# Patient Record
Sex: Male | Born: 1935 | Race: White | Hispanic: No | Smoking: Former smoker
Health system: Southern US, Community
[De-identification: ages and names within clinical notes are randomized; demographics above are authoritative.]

## PROBLEM LIST (undated history)

## (undated) DIAGNOSIS — N4830 Priapism, unspecified: Secondary | ICD-10-CM

## (undated) DIAGNOSIS — R350 Frequency of micturition: Secondary | ICD-10-CM

## (undated) DIAGNOSIS — N476 Balanoposthitis: Secondary | ICD-10-CM

## (undated) DIAGNOSIS — E039 Hypothyroidism, unspecified: Secondary | ICD-10-CM

## (undated) DIAGNOSIS — E785 Hyperlipidemia, unspecified: Secondary | ICD-10-CM

## (undated) DIAGNOSIS — R351 Nocturia: Secondary | ICD-10-CM

## (undated) DIAGNOSIS — R3911 Hesitancy of micturition: Secondary | ICD-10-CM

## (undated) DIAGNOSIS — C61 Malignant neoplasm of prostate: Secondary | ICD-10-CM

## (undated) HISTORY — DX: Nocturia: R35.1

## (undated) HISTORY — DX: Frequency of micturition: R35.0

## (undated) HISTORY — PX: TRANSURETHRAL RESECTION OF PROSTATE: SHX73

## (undated) HISTORY — PX: CIRCUMCISION: SUR203

## (undated) HISTORY — DX: Hesitancy of micturition: R39.11

## (undated) HISTORY — DX: Priapism, unspecified: N48.30

## (undated) HISTORY — PX: CATARACT EXTRACTION: SUR2

## (undated) HISTORY — DX: Balanoposthitis: N47.6

---

## 1999-05-22 ENCOUNTER — Encounter: Admission: RE | Admit: 1999-05-22 | Discharge: 1999-08-20 | Payer: Self-pay | Admitting: Radiation Oncology

## 2013-11-27 ENCOUNTER — Encounter (HOSPITAL_COMMUNITY): Payer: Self-pay | Admitting: Internal Medicine

## 2013-11-27 ENCOUNTER — Inpatient Hospital Stay (HOSPITAL_COMMUNITY)
Admission: AD | Admit: 2013-11-27 | Discharge: 2013-11-30 | DRG: 682 | Disposition: A | Discharge status: Home Health | Payer: Medicare HMO | Source: Other Acute Inpatient Hospital | Attending: Internal Medicine | Admitting: Internal Medicine

## 2013-11-27 DIAGNOSIS — E875 Hyperkalemia: Secondary | ICD-10-CM | POA: Diagnosis present

## 2013-11-27 DIAGNOSIS — Z66 Do not resuscitate: Secondary | ICD-10-CM | POA: Diagnosis present

## 2013-11-27 DIAGNOSIS — Z8546 Personal history of malignant neoplasm of prostate: Secondary | ICD-10-CM

## 2013-11-27 DIAGNOSIS — N179 Acute kidney failure, unspecified: Secondary | ICD-10-CM | POA: Diagnosis present

## 2013-11-27 DIAGNOSIS — E039 Hypothyroidism, unspecified: Secondary | ICD-10-CM | POA: Diagnosis present

## 2013-11-27 DIAGNOSIS — Z79899 Other long term (current) drug therapy: Secondary | ICD-10-CM | POA: Diagnosis not present

## 2013-11-27 DIAGNOSIS — F17220 Nicotine dependence, chewing tobacco, uncomplicated: Secondary | ICD-10-CM | POA: Diagnosis present

## 2013-11-27 DIAGNOSIS — N1330 Unspecified hydronephrosis: Secondary | ICD-10-CM | POA: Diagnosis present

## 2013-11-27 DIAGNOSIS — R319 Hematuria, unspecified: Secondary | ICD-10-CM | POA: Diagnosis present

## 2013-11-27 DIAGNOSIS — I480 Paroxysmal atrial fibrillation: Secondary | ICD-10-CM | POA: Diagnosis present

## 2013-11-27 DIAGNOSIS — N320 Bladder-neck obstruction: Secondary | ICD-10-CM | POA: Diagnosis present

## 2013-11-27 DIAGNOSIS — R5383 Other fatigue: Secondary | ICD-10-CM | POA: Diagnosis present

## 2013-11-27 DIAGNOSIS — J810 Acute pulmonary edema: Secondary | ICD-10-CM | POA: Diagnosis present

## 2013-11-27 DIAGNOSIS — I10 Essential (primary) hypertension: Secondary | ICD-10-CM | POA: Diagnosis present

## 2013-11-27 DIAGNOSIS — Z23 Encounter for immunization: Secondary | ICD-10-CM | POA: Diagnosis not present

## 2013-11-27 DIAGNOSIS — R918 Other nonspecific abnormal finding of lung field: Secondary | ICD-10-CM

## 2013-11-27 DIAGNOSIS — E785 Hyperlipidemia, unspecified: Secondary | ICD-10-CM | POA: Diagnosis present

## 2013-11-27 DIAGNOSIS — I4891 Unspecified atrial fibrillation: Secondary | ICD-10-CM

## 2013-11-27 DIAGNOSIS — N1339 Other hydronephrosis: Secondary | ICD-10-CM

## 2013-11-27 HISTORY — DX: Hyperlipidemia, unspecified: E78.5

## 2013-11-27 HISTORY — DX: Hypothyroidism, unspecified: E03.9

## 2013-11-27 HISTORY — DX: Malignant neoplasm of prostate: C61

## 2013-11-27 LAB — TROPONIN I: Troponin I: <0.3 ng/mL (ref <0.30)

## 2013-11-27 LAB — BASIC METABOLIC PANEL
Anion gap: 19 — ABNORMAL HIGH (ref 5–15)
BUN: 52 mg/dL — ABNORMAL HIGH (ref 6–23)
CO2: 20 mEq/L (ref 19–32)
Calcium: 9 mg/dL (ref 8.4–10.5)
Chloride: 102 mEq/L (ref 96–112)
Creatinine, Ser: 6.26 mg/dL — ABNORMAL HIGH (ref 0.50–1.35)
GFR calc non Af Amer: 7 mL/min — ABNORMAL LOW (ref >90)
GFR, EST AFRICAN AMERICAN: 8 mL/min — AB (ref >90)
GLUCOSE: 102 mg/dL — AB (ref 70–99)
POTASSIUM: 4.6 meq/L (ref 3.7–5.3)
Sodium: 141 mEq/L (ref 137–147)

## 2013-11-27 LAB — MRSA PCR SCREENING: MRSA BY PCR: NEGATIVE

## 2013-11-27 LAB — LACTIC ACID, PLASMA: LACTIC ACID, VENOUS: 0.9 mmol/L (ref 0.5–2.2)

## 2013-11-27 MED ORDER — ACETAMINOPHEN 325 MG PO TABS
650.0000 mg | ORAL_TABLET | Freq: Four times a day (QID) | ORAL | Status: DC | PRN
  Administered 2013-11-27: 650 mg via ORAL
  Filled 2013-11-27: qty 2

## 2013-11-27 MED ORDER — ONDANSETRON HCL 4 MG/2ML IJ SOLN: 4.0000 mg | Freq: Four times a day (QID) | INTRAMUSCULAR | Status: DC | PRN

## 2013-11-27 MED ORDER — ONDANSETRON HCL 4 MG PO TABS: 4.0000 mg | ORAL_TABLET | Freq: Four times a day (QID) | ORAL | Status: DC | PRN

## 2013-11-27 MED ORDER — SODIUM CHLORIDE 0.45 % IV SOLN
INTRAVENOUS | Status: DC
  Administered 2013-11-27: 1000 mL via INTRAVENOUS

## 2013-11-27 MED ORDER — SODIUM CHLORIDE 0.9 % IJ SOLN
3.0000 mL | Freq: Two times a day (BID) | INTRAMUSCULAR | Status: DC
  Administered 2013-11-27 – 2013-11-28 (×2): 3 mL via INTRAVENOUS

## 2013-11-27 MED ORDER — PANTOPRAZOLE SODIUM 40 MG PO TBEC
40.0000 mg | DELAYED_RELEASE_TABLET | Freq: Every day | ORAL | Status: DC
  Administered 2013-11-28 – 2013-11-30 (×3): 40 mg via ORAL
  Filled 2013-11-27 (×2): qty 1

## 2013-11-27 MED ORDER — DILTIAZEM HCL 25 MG/5ML IV SOLN
10.0000 mg | Freq: Once | INTRAVENOUS | Status: AC
  Administered 2013-11-27: 10 mg via INTRAVENOUS
  Filled 2013-11-27: qty 5

## 2013-11-27 MED ORDER — ACETAMINOPHEN 650 MG RE SUPP: 650.0000 mg | Freq: Four times a day (QID) | RECTAL | Status: DC | PRN

## 2013-11-27 MED ORDER — DEXTROSE 5 % IV SOLN
500.0000 mg | INTRAVENOUS | Status: DC
  Administered 2013-11-27 – 2013-11-28 (×2): 500 mg via INTRAVENOUS
  Filled 2013-11-27 (×3): qty 500

## 2013-11-27 MED ORDER — DOCUSATE SODIUM 100 MG PO CAPS
100.0000 mg | ORAL_CAPSULE | Freq: Two times a day (BID) | ORAL | Status: DC
Start: 2013-11-27 — End: 2013-11-27
  Filled 2013-11-27: qty 1

## 2013-11-27 MED ORDER — DILTIAZEM HCL 100 MG IV SOLR
5.0000 mg/h | INTRAVENOUS | Status: DC
  Administered 2013-11-27 – 2013-11-28 (×2): 5 mg/h via INTRAVENOUS
  Administered 2013-11-29: 15 mg/h via INTRAVENOUS
  Administered 2013-11-29: 10 mg/h via INTRAVENOUS
  Filled 2013-11-27 (×3): qty 100

## 2013-11-27 MED ORDER — LEVOTHYROXINE SODIUM 88 MCG PO TABS
88.0000 ug | ORAL_TABLET | Freq: Every day | ORAL | Status: DC
  Administered 2013-11-28 – 2013-11-30 (×3): 88 ug via ORAL
  Filled 2013-11-27 (×4): qty 1

## 2013-11-27 MED ORDER — HYDROCODONE-ACETAMINOPHEN 5-325 MG PO TABS
1.0000 | ORAL_TABLET | ORAL | Status: DC | PRN
Start: 2013-11-27 — End: 2013-11-30

## 2013-11-27 MED ORDER — DEXTROSE 5 % IV SOLN
1.0000 g | INTRAVENOUS | Status: DC
  Administered 2013-11-27 – 2013-11-29 (×3): 1 g via INTRAVENOUS
  Filled 2013-11-27 (×4): qty 10

## 2013-11-27 NOTE — H&P (Addendum)
PCP:  Laverna Peace, NP  Urology Amalia Ha[facility] at [facility]  Chief Complaint:  Transferred from [facility] ER with ARF and bilateral hydronephrosis  HPI: Derrick Odonnell is a 80 y.o. male   has a past medical history of Cancer; Prostate cancer; Hypothyroidism; and Hyperlipidemia.   Presented with  For the past 1 week  patient have had increased headache and fatigue as well as trouble urinating. He noted that his blood pressure was elevated up to 200/100. Occasional mild cough but no fever.  Patient went to see his PCP and was started on Lisinopril for presumed HTN. Patient did mention some vague left sided chest pain vs presure that has been going on for some time. CBC and CMET was done. Cr was noted to be elevated >10 with prior reading 1.0 in June 2015 and K of 5.5 .  Patient initially presented to [place] ER he was found to have bilateral hydronephrosis and a foley catheter was placed producing >1600 ml of urine. Followed by bloody urine. Patient has continued to put out large amounts of urine. CT of the chest  W/o contrast was done showing bilateral plural effusions and atelectasis vs PNA as well as bilateral hydronephrosis.   From there was transferred to [facility] for evaluation with nephrology. Dr. Thomasene Lot have seen patient on arrival to [facility]. He recommends gentle IV hydration to stay on top of post obstruction diuresis and urology consult.   In regards of his Prostate Cancer he was treated with Lupron but this ahs been discontinued as his PSA have decreased. He has been seen last time by Urology in June 2015.  On arrival to [facility] step down patient was noted to have intermittent short episodes of A.fib. No prior hx of this in the past.    Hospitalist was called for admission for ARF likely postobstructive  Review of Systems:    Pertinent positives include: fatigue, headaches,chest pain, non-productive cough  Constitutional:  No weight loss, night sweats, Fevers, chills, weight loss  HEENT:  No  Difficulty  swallowing,Tooth/dental problems,Sore throat,  No sneezing, itching, ear ache, nasal congestion, post nasal drip,  Cardio-vascular:  No Orthopnea, PND, anasarca, dizziness, palpitations.no Bilateral lower extremity swelling  GI:  No heartburn, indigestion, abdominal pain, nausea, vomiting, diarrhea, change in bowel habits, loss of appetite, melena, blood in stool, hematemesis Resp:  no shortness of breath at rest. No dyspnea on exertion, No excess mucus, no productive cough, No , No coughing up of blood.No change in color of mucus.No wheezing. Skin:  no rash or lesions. No jaundice GU:  no dysuria, change in color of urine, no urgency or frequency. No straining to urinate.  No flank pain.  Musculoskeletal:  No joint pain or no joint swelling. No decreased range of motion. No back pain.  Psych:  No change in mood or affect. No depression or anxiety. No memory loss.  Neuro: no localizing neurological complaints, no tingling, no weakness, no double vision, no gait abnormality, no slurred speech, no confusion  Otherwise ROS are negative except for above, 10 systems were reviewed  Past Medical History: Past Medical History  Diagnosis Date  . Cancer   . Prostate cancer   . Hypothyroidism   . Hyperlipidemia    History reviewed. No pertinent past surgical history.   Medications: Prior to Admission medications   Medication Sig Start Date End Date Taking? Authorizing Provider  ALFALFA PO Take 1 capsule by mouth daily.   Yes Historical Provider, MD  B Complex Vitamins (B COMPLEX PO)  Take 1 tablet by mouth daily.   Yes Historical Provider, MD  Calcium Carb-Cholecalciferol (CALCIUM 600 + D PO) Take 1 tablet by mouth daily.   Yes Historical Provider, MD  Coenzyme Q10 (CO Q 10 PO) Take 1 capsule by mouth daily.   Yes Historical Provider, MD  levothyroxine (SYNTHROID, LEVOTHROID) 88 MCG tablet Take 88 mcg by mouth daily before breakfast.   Yes Historical Provider, MD  lisinopril  (PRINIVIL,ZESTRIL) 10 MG tablet Take 10 mg by mouth daily.   Yes Historical Provider, MD  OVER THE COUNTER MEDICATION Take 1 capsule by mouth daily. OMEGA RED   Yes Historical Provider, MD    Allergies:   Allergies  Allergen Reactions  . Lovastatin     Social History:  Ambulatory   independently   Lives at home   With family  Wife [phone] home, [phone] cell   reports that he has quit smoking. His smokeless tobacco use includes Chew. He reports that he does not drink alcohol or use illicit drugs.    Family History: family history is not on file.    Physical Exam: Patient Vitals for the past 24 hrs:  BP Temp Temp src Pulse Resp SpO2 Height Weight  11/27/13 1805 157/91 mmHg 98.3 F (36.8 C) Oral 76 25 98 % 5\' 11"  (1.803 m) 71.9 kg (158 lb 8.2 oz)    1. General:  in No Acute distress 2. Psychological: Alert and   Oriented 3. Head/ENT:   Moist  Mucous Membranes                          Head Non traumatic, neck supple                          Normal  Dentition 4. SKIN:   decreased Skin turgor,  Skin clean Dry and intact no rash 5. Heart: Regular rate and rhythm no Murmur, Rub or gallop 6. Lungs: Clear to auscultation bilaterally, no wheezes or crackles   7. Abdomen: Soft, non-tender, Non distended 8. Lower extremities: no clubbing, cyanosis, or edema 9. Neurologically Grossly intact, moving all 4 extremities equally 10. MSK: Normal range of motion  body mass index is 22.12 kg/(m^2).   Labs on Admission:  No results found for this basename: NA, K, CL, CO2, GLUCOSE, BUN, CREATININE, CALCIUM, MG, PHOS,  in the last 72 hours No results found for this basename: AST, ALT, ALKPHOS, BILITOT, PROT, ALBUMIN,  in the last 72 hours No results found for this basename: LIPASE, AMYLASE,  in the last 72 hours No results found for this basename: WBC, NEUTROABS, HGB, HCT, MCV, PLT,  in the last 72 hours No results found for this basename: CKTOTAL, CKMB, CKMBINDEX, TROPONINI,   in the last 72 hours No results found for this basename: TSH, T4TOTAL, FREET3, T3FREE, THYROIDAB,  in the last 72 hours No results found for this basename: VITAMINB12, FOLATE, FERRITIN, TIBC, IRON, RETICCTPCT,  in the last 72 hours No results found for this basename: HGBA1C    CrCl is unknown because no creatinine reading has been taken. ABG No results found for this basename: phart, pco2, po2, hco3, tco2, acidbasedef, o2sat     No results found for this basename: DDIMER     Other results:  I have pearsonaly reviewed this: ECG REPORT At [facility] Rate: 80  Rhythm: NSR ST&T Change: non ischemic  At [facility]  HR 109  Rhythm A.fib  w RVR w PVC No ischemia   UA RBC too numerous too count  BNP (last 3 results) No results found for this basename: PROBNP,  in the last 8760 hours  Filed Weights   11/27/13 1805  Weight: 71.9 kg (158 lb 8.2 oz)   Cultures: No results found for this basename: sdes, specrequest, cult, reptstatus    Radiological Exams on Admission: No results found.  Chart has been reviewed  Assessment/Plan  80 yo M with postobstructive RF and elevated K.   Present on Admission:  . ARF (acute renal failure) - now sp foley, will continue to monitor electrolytes, monitor postobstructive diuresis. Gentle IVF as per nephrology. No indication for dialysis at this time . Acute pulmonary edema - bilateral pulmonary effusions will repeat CXR tomorrow to evaluate for true infiltrate after diuresis  . Hyperkalemia - will recheck, monitor on telemetry . Hydronephrosis - secondary to obstruction. Urology consult and follow up. PSA has been ordered, continue foley Abnormal findings on telemetry - intermittent A.fib with RVR new finding, will give dose of diltiazem and if needed will place on diltiazem drip. Echo in AM. Check TSH Given ongoing hematuria and low CHAD2 score will hold off on anticoagulation for now.  Abnormal CT - worrisome for possible infiltrate, given low grade  fever and hx of cough will cover with rocephin and azithromycin for presumed CAP for now, repeat CXR in AM to confirm, obtain lactic acid Prophylaxis: SCD , Protonix  CODE STATUS: DNR /DNI as per patient  Other plan as per orders.  I have spent a total of 65 min on this admission extra time was taken to review to chart from outside hospital  Derrick Odonnell 11/27/2013, 7:58 PM  [facility]  Pager [phone]   after 2 AM please page floor coverage PA If 7AM-7PM, please contact the day team taking care of the patient  [email or web address]  Password [email or web address]

## 2013-11-27 NOTE — Progress Notes (Signed)
Cardizem 10mg  bolus administered IV as ordered. Continuing to monitor pt.

## 2013-11-27 NOTE — Progress Notes (Signed)
Dr. Roel Cluck at pt bedside evaluating pt. Made aware pt had some short runs of Afib.

## 2013-11-27 NOTE — Progress Notes (Signed)
11/27/2013 patient transfer from [facility] Emergency room to [facility] via [place]. He is alert, oriented and was place on telemetry and on oxygen. He arrived to the unit with a foley cath and bloody urine. Patient have bruise on left hip and bruises on arms. Patient is up with one assist, but have little weakness. MRSA screen was completed, CHG bath was completed and observe safety video. [facility] RN.

## 2013-11-27 NOTE — Consult Note (Signed)
Derrick Odonnell is an 80 y.o. male referred by Dr Tana Coast   CC;  Renal failure, hyperkalemia HPI: 80yo WM with hx of prostate Ca sent to [facility] ER today as blood work from his PCP showed a Scr of 10.5.  Saw his PCP yest for fatigue and labs were done.  No Hx HTN but BP was hish 200/100.  CO difficulty urinating x 3 months.  CT in ER showed bil hydro.  Foley cath placed with return of >1600cc.  Scr 1.03 in 6/14.    No past medical history on file.Hypothyroidism, Prostate Ca followed by Dr Iran Planas  No past surgical history on file.  No family history on file.no FH of renal Ds Social History:  has no tobacco, alcohol, and drug history on file.Non smoker, nondrinker.  Married. 2 children.  Worked as Administrator for [employer]: Allergies not on file  No prescriptions prior to admission     Lab Results: UA: TNTC RBC's  Done post cath placement  No results found for this basename: WBC, HGB, HCT, PLT,  in the last 72 hours BMET No results found for this basename: NA, K, CL, CO2, GLUCOSE, BUN, CREATININE, CALCIUM, MAG, PHOS,  in the last 72 hours LFT No results found for this basename: PROT, ALBUMIN, AST, ALT, ALKPHOS, BILITOT, BILIDIR, IBILI,  in the last 72 hours No results found.  ROS: No change in vision Decreased hearing, wears hearing aids Had SOB earlier, better now Sxs of reflux No abd pain + edema No neurologic Sxs   PHYSICAL EXAM: Blood pressure 157/91, pulse 76, temperature 98.3 F (36.8 C), temperature source Oral, resp. rate 25, height 5\' 11"  (1.803 m), weight 71.9 kg (158 lb 8.2 oz), SpO2 98.00%. HEENT: PERRLA EOMI NECK:no JVD, No bruits LUNGS:Decreased BS in bases with few faint crackles CARDIAC:RRR wo MRG ABD:+ BS NTND No HSM DPO:EUMPN-3+ edema NEURO:CNI M&SI OX3 no asterixis  Assessment: 1. AKI sec bladder outlet obstruction 2. Mild hyperkalemia 3.   Hx Prostate CA  PLAN: 1. Cont with foley drainage 2. K will be corrected now that he is  unobstructed 3. Check PSA 4. Will need urology to see 5. Daily Scr 6. Will start low rate IV fluids 50cc/hr to prevent volume depletion from post obs diuresis and this can be increased if gets into too neg of a fluid balance 7.   Reg diet with 2gm K restriction for now   Aeralyn Barna T 11/27/2013, 7:07 PM

## 2013-11-28 ENCOUNTER — Inpatient Hospital Stay (HOSPITAL_COMMUNITY): Payer: Medicare HMO

## 2013-11-28 ENCOUNTER — Encounter (HOSPITAL_COMMUNITY): Payer: Self-pay | Admitting: *Deleted

## 2013-11-28 DIAGNOSIS — N1330 Unspecified hydronephrosis: Secondary | ICD-10-CM

## 2013-11-28 DIAGNOSIS — I517 Cardiomegaly: Secondary | ICD-10-CM

## 2013-11-28 DIAGNOSIS — R319 Hematuria, unspecified: Secondary | ICD-10-CM

## 2013-11-28 LAB — RENAL FUNCTION PANEL
ANION GAP: 15 (ref 5–15)
Albumin: 3 g/dL — ABNORMAL LOW (ref 3.5–5.2)
BUN: 42 mg/dL — ABNORMAL HIGH (ref 6–23)
CHLORIDE: 103 meq/L (ref 96–112)
CO2: 21 meq/L (ref 19–32)
Calcium: 8.8 mg/dL (ref 8.4–10.5)
Creatinine, Ser: 4.72 mg/dL — ABNORMAL HIGH (ref 0.50–1.35)
GFR calc Af Amer: 12 mL/min — ABNORMAL LOW (ref >90)
GFR, EST NON AFRICAN AMERICAN: 10 mL/min — AB (ref >90)
Glucose, Bld: 107 mg/dL — ABNORMAL HIGH (ref 70–99)
POTASSIUM: 4.4 meq/L (ref 3.7–5.3)
Phosphorus: 3.7 mg/dL (ref 2.3–4.6)
SODIUM: 139 meq/L (ref 137–147)

## 2013-11-28 LAB — CBC
HCT: 33 % — ABNORMAL LOW (ref 39.0–52.0)
Hemoglobin: 11.1 g/dL — ABNORMAL LOW (ref 13.0–17.0)
MCH: 29.4 pg (ref 26.0–34.0)
MCHC: 33.6 g/dL (ref 30.0–36.0)
MCV: 87.3 fL (ref 78.0–100.0)
Platelets: 222 10*3/uL (ref 150–400)
RBC: 3.78 MIL/uL — ABNORMAL LOW (ref 4.22–5.81)
RDW: 13 % (ref 11.5–15.5)
WBC: 8.7 10*3/uL (ref 4.0–10.5)

## 2013-11-28 LAB — MAGNESIUM: MAGNESIUM: 2 mg/dL (ref 1.5–2.5)

## 2013-11-28 LAB — HEMOGLOBIN A1C
Hgb A1c MFr Bld: 5.5 % (ref <5.7)
Mean Plasma Glucose: 111 mg/dL (ref <117)

## 2013-11-28 LAB — PROCALCITONIN: Procalcitonin: <0.1 ng/mL

## 2013-11-28 LAB — TSH: TSH: 2.78 u[IU]/mL (ref 0.350–4.500)

## 2013-11-28 LAB — PHOSPHORUS: PHOSPHORUS: 3.6 mg/dL (ref 2.3–4.6)

## 2013-11-28 MED ORDER — METHYLPREDNISOLONE SODIUM SUCC 125 MG IJ SOLR
60.0000 mg | INTRAMUSCULAR | Status: DC
Start: 2013-11-28 — End: 2013-11-30
  Administered 2013-11-28 – 2013-11-29 (×2): 60 mg via INTRAVENOUS
  Filled 2013-11-28 (×3): qty 0.96

## 2013-11-28 MED ORDER — INFLUENZA VAC SPLIT QUAD 0.5 ML IM SUSY
0.5000 mL | PREFILLED_SYRINGE | INTRAMUSCULAR | Status: AC
  Administered 2013-11-29: 0.5 mL via INTRAMUSCULAR
  Filled 2013-11-28: qty 0.5

## 2013-11-28 MED ORDER — SODIUM CHLORIDE 0.9 % IV SOLN
INTRAVENOUS | Status: DC
  Administered 2013-11-28: 1000 mL via INTRAVENOUS
  Administered 2013-11-29: 08:00:00 via INTRAVENOUS

## 2013-11-28 MED ORDER — PNEUMOCOCCAL VAC POLYVALENT 25 MCG/0.5ML IJ INJ
0.5000 mL | INJECTION | INTRAMUSCULAR | Status: DC
  Filled 2013-11-28: qty 0.5

## 2013-11-28 NOTE — Progress Notes (Signed)
Dr Roel Cluck notified of pt's conversion from Afib to NSR with PVCs.

## 2013-11-28 NOTE — Progress Notes (Signed)
Echo Lab  2D Echocardiogram completed.  [place], RDCS 11/28/2013 10:27 AM

## 2013-11-28 NOTE — Progress Notes (Signed)
S: sitting up in chair.  No new CO O:BP 130/63  Pulse 70  Temp(Src) 98.8 F (37.1 C) (Oral)  Resp 23  Ht 5\' 11"  (1.803 m)  Wt 67.5 kg (148 lb 13 oz)  BMI 20.76 kg/m2  SpO2 94%  Intake/Output Summary (Last 24 hours) at 11/28/13 0831 Last data filed at 11/28/13 0400  Gross per 24 hour  Intake 331.75 ml  Output   5100 ml  Net -4768.25 ml   Weight change:  TWK:MQKMM and alert CVS:RRR Resp: decreased BS bases Abd:+ BS NTND Ext: no edema NEURO:CNI Ox3 no asterixis   . azithromycin  500 mg Intravenous Q24H  . cefTRIAXone (ROCEPHIN) IVPB 1 gram/50 mL D5W  1 g Intravenous Q24H  . levothyroxine  88 mcg Oral QAC breakfast  . pantoprazole  40 mg Oral Q1200  . sodium chloride  3 mL Intravenous Q12H   No results found. BMET    Component Value Date/Time   NA 139 11/28/2013 0225   K 4.4 11/28/2013 0225   CL 103 11/28/2013 0225   CO2 21 11/28/2013 0225   GLUCOSE 107* 11/28/2013 0225   BUN 42* 11/28/2013 0225   CREATININE 4.72* 11/28/2013 0225   CALCIUM 8.8 11/28/2013 0225   GFRNONAA 10* 11/28/2013 0225   GFRAA 12* 11/28/2013 0225   CBC    Component Value Date/Time   WBC 8.7 11/28/2013 0225   RBC 3.78* 11/28/2013 0225   HGB 11.1* 11/28/2013 0225   HCT 33.0* 11/28/2013 0225   PLT 222 11/28/2013 0225   MCV 87.3 11/28/2013 0225   MCH 29.4 11/28/2013 0225   MCHC 33.6 11/28/2013 0225   RDW 13.0 11/28/2013 0225     Assessment:  1. AKI sec BOO, improving 2. Hyperkalemia, resolved Plan: 1.  Renal fx should cont to improve.  Urology needs to address his obstruction.  Will sign off.  Call if further nephrologic issues.  Increase IV fluids as needed to prevent volume depletion   Anneliese Leblond T

## 2013-11-28 NOTE — Progress Notes (Signed)
[place] TEAM 1 - Stepdown/ICU TEAM Progress Note  Raymir Frommelt SJG:[id number] DOB: [birth year] DOA: 11/27/2013 PCP: Laverna Peace, NP  Admit HPI / Brief Narrative: Derrick Odonnell is a 80 y.o. WM PMHx  Prostate cancer; Hypothyroidism; and Hyperlipidemia.  Presented with  For the past 1 week patient have had increased headache and fatigue as well as trouble urinating. He noted that his blood pressure was elevated up to 200/100. Occasional mild cough but no fever. Patient went to see his PCP and was started on Lisinopril for presumed HTN. Patient did mention some vague left sided chest pain vs presure that has been going on for some time. CBC and CMET was done. Cr was noted to be elevated >10 with prior reading 1.0 in June 2015 and K of 5.5 .  Patient initially presented to [place] ER he was found to have bilateral hydronephrosis and a foley catheter was placed producing >1600 ml of urine. Followed by bloody urine. Patient has continued to put out large amounts of urine.  CT of the chest W/o contrast was done showing bilateral plural effusions and atelectasis vs PNA as well as bilateral hydronephrosis. From there was transferred to [facility] for evaluation with nephrology. Dr. Thomasene Lot have seen patient on arrival to [facility]. He recommends gentle IV hydration to stay on top of post obstruction diuresis and urology consult.  In regards of his Prostate Cancer he was treated with Lupron but this has been discontinued as his PSA have decreased. He has been seen last time by Urology in June 2015.  On arrival to [facility] step down patient was noted to have intermittent short episodes of A.fib. No prior hx of this in the past.  Hospitalist was called for admission for ARF likely postobstructive   HPI/Subjective: 10/10 A./O. x4, NAD, states never had hematuria in the past. Negative SOB, negative CP, negative abdominal pain    Assessment/Plan:  ARF (acute renal failure) - Creatinine improving with hydration -Continue hydration  normal saline now sp foley, will continue to monitor electrolytes -, monitor postobstructive diuresis.  -Gentle IVF as per nephrology. No indication for dialysis at this time  Hydronephrosis with hematuria -Renal fx should cont to improve per nephrology -Consult Urology in a.m. to address his obstruction (most likely will be seen as outpatient) -PSA pending -Hold anticoagulation  CAP  -Abnormal CT - worrisome for possible infiltrate, given low grade fever and hx of cough will cover with rocephin and azithromycin -CXR consistent with CAP, with small bilateral pleural effusion  -Solu-Medrol 60 mg daily -Flutter valve q4 hr while awake -No wheezing on exam so will hold off on bronchodilators  Hyperkalemia -Resolved, continue monitor   A. fib with RVR -Resolved  -Continue Cardizem drip, will transition to PO in a.m. -TSH within normal limit -Troponin x1 negative -Cardiology consult pending -Echocardiogram pending -Strict in and out -Daily weight     Code Status: FULL Family Communication: no family present at time of exam Disposition Plan: Resolution of A. fib, hydronephrosis   Consultants: Cardiology pending Dr. Fleet Contras (Nephrology)    Procedure/Significant Events: 10/10 CXR;Small bilateral pleural effusion with bilateral basilar atelectasis or infiltrate    Culture 10/9 MRSA by PCR negative 10/9 blood x2 pending 10/10 urine pending   Antibiotics: Azithromycin 10/9>> Ceftriaxone 10/9>>   DVT prophylaxis: SCD   Devices    LINES / TUBES:      Continuous Infusions: . sodium chloride 50 mL/hr at 11/28/13 1930  . diltiazem (CARDIZEM) infusion 5 mg/hr (11/28/13 1930)  Objective: VITAL SIGNS: Temp: 98.8 F (37.1 C) (10/10 1927) Temp Source: Oral (10/10 1927) BP: 157/73 mmHg (10/10 1927) Pulse Rate: 78 (10/10 1927) SPO2; FIO2:   Intake/Output Summary (Last 24 hours) at 11/28/13 1945 Last data filed at 11/28/13 1930  Gross per  24 hour  Intake 2061.75 ml  Output   5825 ml  Net -3763.25 ml     Exam: General: A./O. x4, NAD, No acute respiratory distress, some hematuria (clearing per patient and RN) Lungs: Clear to auscultation bilaterally without wheezes or crackles Cardiovascular: Regular rate and rhythm without murmur gallop or rub normal S1 and S2 Abdomen: Nontender, nondistended, soft, bowel sounds positive, no rebound, no ascites, no appreciable mass Extremities: No significant cyanosis, clubbing, or edema bilateral lower extremities  Data Reviewed: Basic Metabolic Panel:  Recent Labs Lab 11/27/13 2100 11/28/13 0225  NA 141 139  K 4.6 4.4  CL 102 103  CO2 20 21  GLUCOSE 102* 107*  BUN 52* 42*  CREATININE 6.26* 4.72*  CALCIUM 9.0 8.8  MG  --  2.0  PHOS  --  3.7  3.6   Liver Function Tests:  Recent Labs Lab 11/28/13 0225  ALBUMIN 3.0*   No results found for this basename: LIPASE, AMYLASE,  in the last 168 hours No results found for this basename: AMMONIA,  in the last 168 hours CBC:  Recent Labs Lab 11/28/13 0225  WBC 8.7  HGB 11.1*  HCT 33.0*  MCV 87.3  PLT 222   Cardiac Enzymes:  Recent Labs Lab 11/27/13 2100  TROPONINI <0.30   BNP (last 3 results) No results found for this basename: PROBNP,  in the last 8760 hours CBG: No results found for this basename: GLUCAP,  in the last 168 hours  Recent Results (from the past 240 hour(s))  MRSA PCR SCREENING     Status: None   Collection Time    11/27/13  6:30 PM      Result Value Ref Range Status   MRSA by PCR NEGATIVE  NEGATIVE Final   Comment:            The GeneXpert MRSA Assay (FDA     approved for NASAL specimens     only), is one component of a     comprehensive MRSA colonization     surveillance program. It is not     intended to diagnose MRSA     infection nor to guide or     monitor treatment for     MRSA infections.     Studies:  Recent x-ray studies have been reviewed in detail by the Attending  Physician  Scheduled Meds:  Scheduled Meds: . azithromycin  500 mg Intravenous Q24H  . cefTRIAXone (ROCEPHIN) IVPB 1 gram/50 mL D5W  1 g Intravenous Q24H  . [START ON 11/29/2013] Influenza vac split quadrivalent PF  0.5 mL Intramuscular Tomorrow-1000  . levothyroxine  88 mcg Oral QAC breakfast  . pantoprazole  40 mg Oral Q1200  . [START ON 11/29/2013] pneumococcal 23 valent vaccine  0.5 mL Intramuscular Tomorrow-1000  . sodium chloride  3 mL Intravenous Q12H    Time spent on care of this patient: 40 mins   Allie Bossier , MD   [facility] Office  [phone] Pager [phone]  On-Call/Text Page:      [email or web address]      password [email or web address]  If 7PM-7AM, please contact night-coverage [email or web address] Password [email or web address] 11/28/2013, 7:45 PM   LOS: 1 day

## 2013-11-29 ENCOUNTER — Inpatient Hospital Stay (HOSPITAL_COMMUNITY): Payer: Medicare HMO

## 2013-11-29 DIAGNOSIS — I4891 Unspecified atrial fibrillation: Secondary | ICD-10-CM

## 2013-11-29 DIAGNOSIS — N179 Acute kidney failure, unspecified: Principal | ICD-10-CM

## 2013-11-29 LAB — COMPREHENSIVE METABOLIC PANEL
ALT: 18 U/L (ref 0–53)
AST: 24 U/L (ref 0–37)
Albumin: 2.9 g/dL — ABNORMAL LOW (ref 3.5–5.2)
Alkaline Phosphatase: 119 U/L — ABNORMAL HIGH (ref 39–117)
Anion gap: 17 — ABNORMAL HIGH (ref 5–15)
BUN: 23 mg/dL (ref 6–23)
CALCIUM: 8.8 mg/dL (ref 8.4–10.5)
CO2: 18 meq/L — AB (ref 19–32)
Chloride: 103 mEq/L (ref 96–112)
Creatinine, Ser: 1.65 mg/dL — ABNORMAL HIGH (ref 0.50–1.35)
GFR calc Af Amer: 42 mL/min — ABNORMAL LOW (ref >90)
GFR calc non Af Amer: 36 mL/min — ABNORMAL LOW (ref >90)
Glucose, Bld: 148 mg/dL — ABNORMAL HIGH (ref 70–99)
Potassium: 4.3 mEq/L (ref 3.7–5.3)
SODIUM: 138 meq/L (ref 137–147)
TOTAL PROTEIN: 7 g/dL (ref 6.0–8.3)
Total Bilirubin: 0.3 mg/dL (ref 0.3–1.2)

## 2013-11-29 LAB — LACTIC ACID, PLASMA: Lactic Acid, Venous: 0.8 mmol/L (ref 0.5–2.2)

## 2013-11-29 LAB — CBC WITH DIFFERENTIAL/PLATELET
Basophils Absolute: 0 10*3/uL (ref 0.0–0.1)
Basophils Relative: 0 % (ref 0–1)
EOS ABS: 0 10*3/uL (ref 0.0–0.7)
EOS PCT: 0 % (ref 0–5)
HCT: 34.3 % — ABNORMAL LOW (ref 39.0–52.0)
HEMOGLOBIN: 11.6 g/dL — AB (ref 13.0–17.0)
LYMPHS ABS: 0.5 10*3/uL — AB (ref 0.7–4.0)
Lymphocytes Relative: 5 % — ABNORMAL LOW (ref 12–46)
MCH: 29.8 pg (ref 26.0–34.0)
MCHC: 33.8 g/dL (ref 30.0–36.0)
MCV: 88.2 fL (ref 78.0–100.0)
Monocytes Absolute: 0.2 10*3/uL (ref 0.1–1.0)
Monocytes Relative: 2 % — ABNORMAL LOW (ref 3–12)
Neutro Abs: 8.7 10*3/uL — ABNORMAL HIGH (ref 1.7–7.7)
Neutrophils Relative %: 93 % — ABNORMAL HIGH (ref 43–77)
Platelets: 214 10*3/uL (ref 150–400)
RBC: 3.89 MIL/uL — AB (ref 4.22–5.81)
RDW: 12.9 % (ref 11.5–15.5)
WBC: 9.4 10*3/uL (ref 4.0–10.5)

## 2013-11-29 LAB — MAGNESIUM: Magnesium: 1.9 mg/dL (ref 1.5–2.5)

## 2013-11-29 MED ORDER — DILTIAZEM HCL ER 60 MG PO CP12
60.0000 mg | ORAL_CAPSULE | Freq: Two times a day (BID) | ORAL | Status: DC
  Administered 2013-11-29 (×2): 60 mg via ORAL
  Filled 2013-11-29 (×4): qty 1

## 2013-11-29 MED ORDER — AZITHROMYCIN 500 MG PO TABS
500.0000 mg | ORAL_TABLET | Freq: Every day | ORAL | Status: DC
  Administered 2013-11-29 – 2013-11-30 (×2): 500 mg via ORAL
  Filled 2013-11-29 (×2): qty 1

## 2013-11-29 MED ORDER — METOPROLOL TARTRATE 1 MG/ML IV SOLN
2.5000 mg | Freq: Once | INTRAVENOUS | Status: AC
  Administered 2013-11-29: 2.5 mg via INTRAVENOUS
  Filled 2013-11-29: qty 5

## 2013-11-29 NOTE — Progress Notes (Addendum)
Fredirick Maudlin NP made awareof increase of A Fib rates 110-130's. Informed that Cardizem drip now infusing at 15mg /hr. New orders received. 0705 Lopressor 2.5mg  administered IV as ordered.HR down to 80-90's Cardizem drip titrated down to 10mg /hr. VSS 99 23 95% 130/86. Pt sleeping comfortably.

## 2013-11-29 NOTE — Progress Notes (Signed)
Derrick Odonnell - Stepdown/ICU TEAM Progress Note  Derrick Odonnell HQI:[id number] DOB: May 31, 1926 DOA: 11/27/2013 PCP: Derrick Peace, NP  Admit HPI / Brief Narrative: Derrick Odonnell is a 80 y.o. WM PMHx  Prostate cancer; Hypothyroidism; and Hyperlipidemia.  Presented with  For the past Odonnell week patient have had increased headache and fatigue as well as trouble urinating. He noted that his blood pressure was elevated up to 200/100. Occasional mild cough but no fever. Patient went to see his PCP and was started on Lisinopril for presumed HTN. Patient did mention some vague left sided chest pain vs presure that has been going on for some time. CBC and CMET was done. Cr was noted to be elevated >10 with prior reading Odonnell.0 in June 2015 and K of 5.5 .  Patient initially presented to [place] ER he was found to have bilateral hydronephrosis and a foley catheter was placed producing >1600 ml of urine. Followed by bloody urine. Patient has continued to put out large amounts of urine.  CT of the chest W/o contrast was done showing bilateral plural effusions and atelectasis vs PNA as well as bilateral hydronephrosis. From there was transferred to [facility] for evaluation with nephrology. Dr. Thomasene Odonnell have seen patient on arrival to [facility]. He recommends gentle IV hydration to stay on top of post obstruction diuresis and urology consult.  In regards of his Prostate Cancer he was treated with Lupron but this has been discontinued as his PSA have decreased. He has been seen last time by Urology in June 2015.  On arrival to [facility] step down patient was noted to have intermittent short episodes of A.fib. No prior hx of this in the past.  Hospitalist was called for admission for ARF likely postobstructive   HPI/Subjective: 10/11 A./O. x4, NAD, states never had hematuria in the past. Negative SOB, negative CP, negative abdominal pain. States Derrick Odonnell (urology) is his urologist, last visit was June 2015 at which time his PSA had gone  from normal to 10. Derrick Odonnell and patient stated that at [employer] approximately Odonnell L of urine was evacuated after obtaining Foley catheter.      Assessment/Plan:  ARF (acute renal failure) - Creatinine improving with hydration -Continue hydration normal saline  13ml/hr;  -, monitor postobstructive diuresis.  -per nephrology. No indication for dialysis at this time -Obtain ultrasound kidney   Hydronephrosis with hematuria -PSA pending -Hold anticoagulation -Phone consult with Dr. Claudia Desanctis (urology) and agrees that patient to be discharged with Foley when ready. Followup with his regular urologist Derrick Odonnell  CAP  -Abnormal CT - worrisome for possible infiltrate, given low grade fever and hx of cough will cover with rocephin and azithromycin -CXR consistent with CAP, with small bilateral pleural effusion  -Solu-Medrol 60 mg daily -Flutter valve q4 hr while awake -No wheezing on exam so will hold off on bronchodilators  Hyperkalemia -Resolved, continue monitor   A. fib with RVR -Resolved, most likely secondary to acute renal failure secondary to urinary obstruction.  -DC Cardizem drip, start Cardizem PO 60 mg BID -TSH within normal limit -Troponin x1 negative -Echocardiogram pending -Cardiology consult pending -Strict in and out; I./O. since admission -5.5 L -Daily weight; admission weight standing = 71.9 kg, 10/11 weight bed= 65.8 kg     Code Status: FULL Family Communication: family present at time of exam Disposition Plan: Resolution of A. fib, hydronephrosis   Consultants: Dr. Fleet Contras (Nephrology) Dr. Claudia Desanctis (urology) phone consult Cardiology consult pending  Procedure/Significant Events: 10/10 CXR;Small bilateral pleural effusion with bilateral basilar atelectasis or infiltrate    Culture 10/9 MRSA by PCR negative 10/9 blood x2 pending 10/10 urine pending   Antibiotics: Azithromycin 10/9>> Ceftriaxone  10/9>>   DVT prophylaxis: SCD   Devices    LINES / TUBES:      Continuous Infusions: . sodium chloride 100 mL/hr at 11/29/13 0822  . diltiazem (CARDIZEM) infusion 10 mg/hr (11/29/13 0720)    Objective: VITAL SIGNS: Temp: 98.8 F (37.Odonnell C) (10/11 0738) Temp Source: Oral (10/11 0738) BP: 122/73 mmHg (10/11 0800) Pulse Rate: 54 (10/11 0800) SPO2; 94% on room air Cardizem FIO2:   Intake/Output Summary (Last 24 hours) at 11/29/13 1156 Last data filed at 11/29/13 1140  Gross per 24 hour  Intake   2295 ml  Output   3225 ml  Net   -930 ml     Exam: General: A./O. x4, NAD, No acute respiratory distress, hematuria has resolved  Lungs: Clear to auscultation bilaterally without wheezes or crackles Cardiovascular: Regular rate and rhythm without murmur gallop or rub normal S1 and S2 Abdomen: Nontender, nondistended, soft, bowel sounds positive, no rebound, no ascites, no appreciable mass Extremities: No significant cyanosis, clubbing, or edema bilateral lower extremities  Data Reviewed: Basic Metabolic Panel:  Recent Labs Lab 11/27/13 2100 11/28/13 0225 11/29/13 0240  NA 141 139 138  K 4.6 4.4 4.3  CL 102 103 103  CO2 20 21 18*  GLUCOSE 102* 107* 148*  BUN 52* 42* 23  CREATININE 6.26* 4.72* Odonnell.65*  CALCIUM 9.0 8.8 8.8  MG  --  2.0 Odonnell.9  PHOS  --  3.7  3.6  --    Liver Function Tests:  Recent Labs Lab 11/28/13 0225 11/29/13 0240  AST  --  24  ALT  --  18  ALKPHOS  --  119*  BILITOT  --  0.3  PROT  --  7.0  ALBUMIN 3.0* 2.9*   No results found for this basename: LIPASE, AMYLASE,  in the last 168 hours No results found for this basename: AMMONIA,  in the last 168 hours CBC:  Recent Labs Lab 11/28/13 0225 11/29/13 0240  WBC 8.7 9.4  NEUTROABS  --  8.7*  HGB 11.Odonnell* 11.6*  HCT 33.0* 34.3*  MCV 87.3 88.2  PLT 222 214   Cardiac Enzymes:  Recent Labs Lab 11/27/13 2100  TROPONINI <0.30   BNP (last 3 results) No results found for this  basename: PROBNP,  in the last 8760 hours CBG: No results found for this basename: GLUCAP,  in the last 168 hours  Recent Results (from the past 240 hour(s))  MRSA PCR SCREENING     Status: None   Collection Time    11/27/13  6:30 PM      Result Value Ref Range Status   MRSA by PCR NEGATIVE  NEGATIVE Final   Comment:            The GeneXpert MRSA Assay (FDA     approved for NASAL specimens     only), is one component of a     comprehensive MRSA colonization     surveillance program. It is not     intended to diagnose MRSA     infection nor to guide or     monitor treatment for     MRSA infections.     Studies:  Recent x-ray studies have been reviewed in detail by the Attending Physician  Scheduled Meds:  Scheduled Meds: .  azithromycin  500 mg Oral Daily  . cefTRIAXone (ROCEPHIN) IVPB Odonnell gram/50 mL D5W  Odonnell g Intravenous Q24H  . Influenza vac split quadrivalent PF  0.5 mL Intramuscular Tomorrow-1000  . levothyroxine  88 mcg Oral QAC breakfast  . methylPREDNISolone (SOLU-MEDROL) injection  60 mg Intravenous Q24H  . pantoprazole  40 mg Oral Q1200  . pneumococcal 23 valent vaccine  0.5 mL Intramuscular Tomorrow-1000  . sodium chloride  3 mL Intravenous Q12H    Time spent on care of this patient: 40 mins   Allie Bossier , MD   [facility] Office  [phone] Pager [phone]  On-Call/Text Page:      [email or web address]      password [email or web address]  If 7PM-7AM, please contact night-coverage [email or web address] Password [email or web address] 11/29/2013, 11:56 AM   LOS: 2 days

## 2013-11-29 NOTE — Progress Notes (Signed)
AFib rate up tp 118-130's Cardiazem gtt increased to 15mg .VSS B/P 147/98

## 2013-11-29 NOTE — Progress Notes (Signed)
Fredirick Maudlin NP made aware of py back in Afib rates 96-108. Cardizem drip increased to 10mg . Will continue to monitor VS and rhythmn

## 2013-11-29 NOTE — Consult Note (Signed)
Primary Physician: Primary Cardiologist:  New     HPI:  Asked to see re afib   Patinet has a history of prostate cancer, hypothyroidism and HL  Admitted on 10/9 no transfer from [place] where he was admitte forHA and fatigue and problems urinating.  BP 200/100 Recently started on ACE I  Vague L sided CP Cr 10  Had bilat hydronephrosis  Foley placed CT of chest with pleural effusions  Transferred to [place]   On arrival to step down found to have intermittent short afib   Echo yesterday showed LVEF 50 to 55%  LA mildly dilated.  MOderate diastolic dysfunction.  Converted back and for from afib to SR  Patient denies palpitations.   No SOB  Says he can hear heart beat in ear at times       Past Medical History  Diagnosis Date  . Hypothyroidism   . Hyperlipidemia   . Prostate cancer     Medications Prior to Admission  Medication Sig Dispense Refill  . ALFALFA PO Take 1 capsule by mouth daily.      . B Complex Vitamins (B COMPLEX PO) Take 1 tablet by mouth daily.      . Calcium Carb-Cholecalciferol (CALCIUM 600 + D PO) Take 1 tablet by mouth daily.      . Coenzyme Q10 (CO Q 10 PO) Take 1 capsule by mouth daily.      Marland Kitchen levothyroxine (SYNTHROID, LEVOTHROID) 88 MCG tablet Take 88 mcg by mouth daily before breakfast.      . lisinopril (PRINIVIL,ZESTRIL) 10 MG tablet Take 10 mg by mouth daily.      Marland Kitchen OVER THE COUNTER MEDICATION Take 1 capsule by mouth daily. OMEGA RED         . azithromycin  500 mg Oral Daily  . cefTRIAXone (ROCEPHIN) IVPB 1 gram/50 mL D5W  1 g Intravenous Q24H  . diltiazem  60 mg Oral Q12H  . levothyroxine  88 mcg Oral QAC breakfast  . methylPREDNISolone (SOLU-MEDROL) injection  60 mg Intravenous Q24H  . pantoprazole  40 mg Oral Q1200  . pneumococcal 23 valent vaccine  0.5 mL Intramuscular Tomorrow-1000  . sodium chloride  3 mL Intravenous Q12H    Infusions: . sodium chloride 100 mL/hr at 11/29/13 5361    No Known Allergies  History   Social  History  . Marital Status: Married    Spouse Name: N/A    Number of Children: N/A  . Years of Education: N/A   Occupational History  . Not on file.   Social History Main Topics  . Smoking status: Former Research scientist (life sciences)  . Smokeless tobacco: Current User    Types: Chew  . Alcohol Use: No  . Drug Use: No  . Sexual Activity: Not on file   Other Topics Concern  . Not on file   Social History Narrative  . No narrative on file    Family History  Problem Relation Age of Onset  . Diabetes type II Father   . Bladder Cancer Brother     REVIEW OF SYSTEMS:  All systems reviewed  Negative to the above problem except as noted above.    PHYSICAL EXAM: Filed Vitals:   11/29/13 1318  BP: 133/71  Pulse:   Temp:   Resp:      Intake/Output Summary (Last 24 hours) at 11/29/13 1522 Last data filed at 11/29/13 1443  Gross per 24 hour  Intake   2395 ml  Output  2725 ml  Net   -330 ml    General:  Well appearing. No respiratory difficulty HEENT: normal Neck: supple. no JVD. Carotids 2+ bilat; no bruits. No lymphadenopathy or thryomegaly appreciated. Cor: PMI nondisplaced. Regular rate & rhythm. No rubs, gallops or murmurs. Lungs: clear Abdomen: soft, nontender, nondistended. No hepatosplenomegaly. No bruits or masses. Good bowel sounds. Extremities: no cyanosis, clubbing, rash, edema Neuro: alert & oriented x 3, cranial nerves grossly intact. moves all 4 extremities w/o difficulty. Affect pleasant.  ECG:  Afib  109 bpm  Occasional PVC    Results for orders placed during the hospital encounter of 11/27/13 (from the past 24 hour(s))  COMPREHENSIVE METABOLIC PANEL     Status: Abnormal   Collection Time    11/29/13  2:40 AM      Result Value Ref Range   Sodium 138  137 - 147 mEq/L   Potassium 4.3  3.7 - 5.3 mEq/L   Chloride 103  96 - 112 mEq/L   CO2 18 (*) 19 - 32 mEq/L   Glucose, Bld 148 (*) 70 - 99 mg/dL   BUN 23  6 - 23 mg/dL   Creatinine, Ser 1.65 (*) 0.50 - 1.35 mg/dL    Calcium 8.8  8.4 - 10.5 mg/dL   Total Protein 7.0  6.0 - 8.3 g/dL   Albumin 2.9 (*) 3.5 - 5.2 g/dL   AST 24  0 - 37 U/L   ALT 18  0 - 53 U/L   Alkaline Phosphatase 119 (*) 39 - 117 U/L   Total Bilirubin 0.3  0.3 - 1.2 mg/dL   GFR calc non Af Amer 36 (*) >90 mL/min   GFR calc Af Amer 42 (*) >90 mL/min   Anion gap 17 (*) 5 - 15  CBC WITH DIFFERENTIAL     Status: Abnormal   Collection Time    11/29/13  2:40 AM      Result Value Ref Range   WBC 9.4  4.0 - 10.5 K/uL   RBC 3.89 (*) 4.22 - 5.81 MIL/uL   Hemoglobin 11.6 (*) 13.0 - 17.0 g/dL   HCT 34.3 (*) 39.0 - 52.0 %   MCV 88.2  78.0 - 100.0 fL   MCH 29.8  26.0 - 34.0 pg   MCHC 33.8  30.0 - 36.0 g/dL   RDW 12.9  11.5 - 15.5 %   Platelets 214  150 - 400 K/uL   Neutrophils Relative % 93 (*) 43 - 77 %   Neutro Abs 8.7 (*) 1.7 - 7.7 K/uL   Lymphocytes Relative 5 (*) 12 - 46 %   Lymphs Abs 0.5 (*) 0.7 - 4.0 K/uL   Monocytes Relative 2 (*) 3 - 12 %   Monocytes Absolute 0.2  0.1 - 1.0 K/uL   Eosinophils Relative 0  0 - 5 %   Eosinophils Absolute 0.0  0.0 - 0.7 K/uL   Basophils Relative 0  0 - 1 %   Basophils Absolute 0.0  0.0 - 0.1 K/uL  MAGNESIUM     Status: None   Collection Time    11/29/13  2:40 AM      Result Value Ref Range   Magnesium 1.9  1.5 - 2.5 mg/dL  LACTIC ACID, PLASMA     Status: None   Collection Time    11/29/13  2:40 AM      Result Value Ref Range   Lactic Acid, Venous 0.8  0.5 - 2.2 mmol/L   Dg Chest 2  View  11/28/2013   CLINICAL DATA:  Cough, congestion  EXAM: CHEST  2 VIEW  COMPARISON:  11/27/2013  FINDINGS: Cardiomediastinal silhouette is stable. There is small bilateral pleural effusion with bilateral basilar atelectasis or infiltrate. No pulmonary edema. Again noted compression fracture of T12 vertebral body.  IMPRESSION: Small bilateral pleural effusion with bilateral basilar atelectasis or infiltrate. No pulmonary edema.   Electronically Signed   By: Lahoma Crocker M.D.   On: 11/28/2013 11:44      ASSESSMENT:  Patient is an 80 yo admitted for renal evaluation  Found to be in afib intermittently while here Now rates are better while in afib    Echo shows normal LV function  Mild LAE  TSH is normal   Patient relatively symptom free on current regimen  May be able to increase dilt some  Would follow bp and HR With age he is at increased risk for stroke  Should be on anticoagulation (heparin, coumadin, NOAC)  I am concerned about bleeding risk  Had reported hematuria while at [place] work up in progress.   If felt not high risk for signif bleeding would recomm heparin with transition to coumadin (unless renal function really stabilizes)  Will not start  Await input from urology.    Will continue to follow

## 2013-11-30 LAB — URINE CULTURE
Colony Count: NO GROWTH
Culture: NO GROWTH

## 2013-11-30 LAB — PSA: PSA: 12.91 ng/mL — AB (ref <=4.00)

## 2013-11-30 MED ORDER — PREDNISONE 20 MG PO TABS
40.0000 mg | ORAL_TABLET | Freq: Every day | ORAL | Status: DC
  Filled 2013-11-30 (×2): qty 2

## 2013-11-30 MED ORDER — LEVOFLOXACIN 750 MG PO TABS: 750.0000 mg | ORAL_TABLET | ORAL | Status: DC

## 2013-11-30 MED ORDER — DILTIAZEM HCL ER COATED BEADS 240 MG PO CP24: 240.0000 mg | ORAL_CAPSULE | Freq: Every day | ORAL | Status: DC

## 2013-11-30 MED ORDER — LEVOFLOXACIN IN D5W 750 MG/150ML IV SOLN
750.0000 mg | INTRAVENOUS | Status: DC
  Administered 2013-11-30: 750 mg via INTRAVENOUS
  Filled 2013-11-30: qty 150

## 2013-11-30 MED ORDER — DILTIAZEM HCL ER COATED BEADS 240 MG PO CP24
240.0000 mg | ORAL_CAPSULE | Freq: Every day | ORAL | Status: DC
  Administered 2013-11-30: 240 mg via ORAL
  Filled 2013-11-30: qty 1

## 2013-11-30 MED ORDER — TAMSULOSIN HCL 0.4 MG PO CAPS
0.4000 mg | ORAL_CAPSULE | Freq: Every day | ORAL | Status: DC
  Filled 2013-11-30: qty 1

## 2013-11-30 MED ORDER — TAMSULOSIN HCL 0.4 MG PO CAPS
0.4000 mg | ORAL_CAPSULE | Freq: Every day | ORAL | Status: DC
Start: 2013-11-30 — End: 2019-06-11

## 2013-11-30 MED ORDER — PREDNISONE 20 MG PO TABS: 40.0000 mg | ORAL_TABLET | Freq: Every day | ORAL | Status: DC

## 2013-11-30 NOTE — Care Management Note (Signed)
CARE MANAGEMENT NOTE 11/30/2013  Patient:  Sciuto,Jamol   Account Number:  [id number]  Date Initiated:  11/30/2013  Documentation initiated by:  Lizabeth Leyden  Subjective/Objective Assessment:   admitted with urinary obstruction,ARF  medical hx: prostate cancer     Action/Plan:   progression of care and discharge planning   Anticipated DC Date:  12/01/2013   Anticipated DC Plan:  [place]  CM consult      [facility] Choice  HOME HEALTH   Choice offered to / List presented to:  C-4 Adult Children        HH arranged  HH-1 RN      [place].   Status of service:  In process, will continue to follow Medicare Important Message given?  YES (If response is "NO", the following Medicare IM given date fields will be blank) Date Medicare IM given:  11/30/2013 Medicare IM given by:  Gayland Nicol Date Additional Medicare IM given:   Additional Medicare IM given by:    Discharge Disposition:  [place]  Per UR Regulation:    If discussed at Long Length of Stay Meetings, dates discussed:    Comments:  11/30/2013 Met with pt and family, IM given to pt , Pt has DME, daughter is CNA and will assist this pt as needed. Jasmine Pang RN MPH, case manager, [phone]

## 2013-11-30 NOTE — Progress Notes (Signed)
ANTIBIOTIC CONSULT NOTE - INITIAL  Pharmacy Consult for levaquin Indication: CAP  No Known Allergies  Patient Measurements: Height: 5\' 11"  (180.3 cm) Weight: 151 lb (68.493 kg) IBW/kg (Calculated) : 75.3  Vital Signs: Temp: 97.7 F (36.5 C) (10/12 0929) Temp Source: Oral (10/12 0929) BP: 171/79 mmHg (10/12 0929) Pulse Rate: 90 (10/12 0929) Intake/Output from previous day: 10/11 0701 - 10/12 0700 In: 1200 [P.O.:600; I.V.:600] Out: 2275 [Urine:2275] Intake/Output from this shift:    Labs:  Recent Labs  11/27/13 2100 11/28/13 0225 11/29/13 0240  WBC  --  8.7 9.4  HGB  --  11.1* 11.6*  PLT  --  222 214  CREATININE 6.26* 4.72* 1.65*   Estimated Creatinine Clearance: 31.7 ml/min (by C-G formula based on Cr of 1.65). No results found for this basename: VANCOTROUGH, Corlis Leak, VANCORANDOM, GENTTROUGH, GENTPEAK, GENTRANDOM, TOBRATROUGH, TOBRAPEAK, TOBRARND, AMIKACINPEAK, AMIKACINTROU, AMIKACIN,  in the last 72 hours   Microbiology: Recent Results (from the past 720 hour(s))  MRSA PCR SCREENING     Status: None   Collection Time    11/27/13  6:30 PM      Result Value Ref Range Status   MRSA by PCR NEGATIVE  NEGATIVE Final   Comment:            The GeneXpert MRSA Assay (FDA     approved for NASAL specimens     only), is one component of a     comprehensive MRSA colonization     surveillance program. It is not     intended to diagnose MRSA     infection nor to guide or     monitor treatment for     MRSA infections.  CULTURE, BLOOD (ROUTINE X 2)     Status: None   Collection Time    11/27/13 10:25 PM      Result Value Ref Range Status   Specimen Description BLOOD RIGHT ARM   Final   Special Requests BOTTLES DRAWN AEROBIC AND ANAEROBIC 10CC   Final   Culture  Setup Time     Final   Value: 11/28/2013 03:19     Performed at [facility]   Culture     Final   Value:        BLOOD CULTURE RECEIVED NO GROWTH TO DATE CULTURE WILL BE HELD FOR 5 DAYS BEFORE ISSUING  A FINAL NEGATIVE REPORT     Performed at [facility]   Report Status PENDING   Incomplete  CULTURE, BLOOD (ROUTINE X 2)     Status: None   Collection Time    11/27/13 10:35 PM      Result Value Ref Range Status   Specimen Description BLOOD RIGHT HAND   Final   Special Requests BOTTLES DRAWN AEROBIC AND ANAEROBIC 10CC   Final   Culture  Setup Time     Final   Value: 11/28/2013 03:20     Performed at [facility]   Culture     Final   Value:        BLOOD CULTURE RECEIVED NO GROWTH TO DATE CULTURE WILL BE HELD FOR 5 DAYS BEFORE ISSUING A FINAL NEGATIVE REPORT     Performed at [facility]   Report Status PENDING   Incomplete    Medical History: Past Medical History  Diagnosis Date  . Hypothyroidism   . Hyperlipidemia   . Prostate cancer     Assessment: Patient is an 80 y.o M transferred [facility].  CXR on 10/10 showed "small bilateral pleural effusion with bilateral basilar atelectasis or infiltrate."  To start levaquin for suspected CAP.  Scr decreased from 4.72 to 1.65 today (est crcl~32).  10/09 bcx x2>> ngtd 10/11 ucx >> ngtd   Plan:  1) levaquin 750mg  IV q48h  Hubert Raatz P 11/30/2013,11:00 AM

## 2013-11-30 NOTE — Discharge Summary (Addendum)
Physician Discharge Summary  Vershawn Westrup HYQ:[id number] DOB: 20-Sep-1926 DOA: 11/27/2013  PCP: Laverna Peace, NP  Admit date: 11/27/2013 Discharge date: 11/30/2013  Time spent: 35 minutes  Recommendations for Outpatient Follow-up:  1. PCP to address anticoagulation for atrial fib 2. Home health RN 3. Urology to eval elevated PSA- appointment Oct 21  Discharge Diagnoses:  Active Problems:   ARF (acute renal failure)   Acute pulmonary edema   Hyperkalemia   Hydronephrosis   Atrial fibrillation with RVR   Discharge Condition: improved  Diet recommendation: cardiac  Filed Weights   11/28/13 1609 11/29/13 2022 11/30/13 0238  Weight: 65.8 kg (145 lb 1 oz) 68.493 kg (151 lb) 68.493 kg (151 lb)    History of present illness:  Derrick Odonnell is a 80 y.o. WM PMHx Prostate cancer; Hypothyroidism; and Hyperlipidemia.  Presented with  For the past 1 week patient have had increased headache and fatigue as well as trouble urinating. He noted that his blood pressure was elevated up to 200/100. Occasional mild cough but no fever. Patient went to see his PCP and was started on Lisinopril for presumed HTN. Patient did mention some vague left sided chest pain vs presure that has been going on for some time. CBC and CMET was done. Cr was noted to be elevated >10 with prior reading 1.0 in June 2015 and K of 5.5 .  Patient initially presented to [place] [facility] he was found to have bilateral hydronephrosis and a foley catheter was placed producing >1600 ml of urine. Followed by bloody urine. Patient has continued to put out large amounts of urine.  CT of the chest W/o contrast was done showing bilateral plural effusions and atelectasis vs PNA as well as bilateral hydronephrosis. From there was transferred to [facility] for evaluation with nephrology. Dr. Thomasene Lot have seen patient on arrival to [facility]. He recommends gentle IV hydration to stay on top of post obstruction diuresis and urology consult.  In regards of his Prostate  Cancer he was treated with Lupron but this has been discontinued as his PSA have decreased. He has been seen last time by Urology in June 2015.  On arrival to [facility] step down patient was noted to have intermittent short episodes of A.fib. No prior hx of this in the past.  Hospitalist was called for admission for ARF likely postobstructive   Hospital Course:  ARF (acute renal failure)  - Creatinine improved  -monitor postobstructive diuresis.   Hydronephrosis with hematuria  -PSA 12 -Phone consult with Dr. Claudia Desanctis (urology) and agrees that patient to be discharged with Foley  Followup with his regular urologist Dr. Alona Bene   CAP  -Abnormal CT - worrisome for possible infiltrate, -prednisone/abx  Hyperkalemia  -Resolved, continue monitor   A. fib with RVR  -Resolved, most likely secondary to acute renal failure secondary to urinary obstruction.  - no anticoagulation for now Cr improved And urine clean with no hematuria; change cardizem to daily LA -TSH within normal limit     Procedures:  Echo  U/s kidneys  Consultations:  Renal  Urology  cardiology  Discharge Exam: Filed Vitals:   11/30/13 0929  BP: 171/79  Pulse: 90  Temp: 97.7 F (36.5 C)  Resp: 20    General: A+Ox3, NAD- anxious to go home Cardiovascular: rrr Respiratory: clear, no wheezing  Discharge Instructions You were cared for by a hospitalist during your hospital stay. If you have any questions about your discharge medications or the care you received while you were in  the hospital after you are discharged, you can call the unit and asked to speak with the hospitalist on call if the hospitalist that took care of you is not available. Once you are discharged, your primary care physician will handle any further medical issues. Please note that NO REFILLS for any discharge medications will be authorized once you are discharged, as it is imperative that you return to your primary care physician  (or establish a relationship with a primary care physician if you do not have one) for your aftercare needs so that they can reassess your need for medications and monitor your lab values.  Discharge Instructions   Diet - low sodium heart healthy    Complete by:  As directed      Discharge instructions    Complete by:  As directed   Cbc, bmp 1 week -needs follow up with urology Home health RN     Increase activity slowly    Complete by:  As directed           Current Discharge Medication List    START taking these medications   Details  diltiazem (CARDIZEM CD) 240 MG 24 hr capsule Take 1 capsule (240 mg total) by mouth daily. Qty: 30 capsule, Refills: 0    levofloxacin (LEVAQUIN) 750 MG tablet Take 1 tablet (750 mg total) by mouth every other day. Qty: 2 tablet, Refills: 0    predniSONE (DELTASONE) 20 MG tablet Take 2 tablets (40 mg total) by mouth daily with breakfast. Qty: 8 tablet, Refills: 0    tamsulosin (FLOMAX) 0.4 MG CAPS capsule Take 1 capsule (0.4 mg total) by mouth daily after supper. Qty: 30 capsule, Refills: 0      CONTINUE these medications which have NOT CHANGED   Details  ALFALFA PO Take 1 capsule by mouth daily.    B Complex Vitamins (B COMPLEX PO) Take 1 tablet by mouth daily.    Calcium Carb-Cholecalciferol (CALCIUM 600 + D PO) Take 1 tablet by mouth daily.    Coenzyme Q10 (CO Q 10 PO) Take 1 capsule by mouth daily.    levothyroxine (SYNTHROID, LEVOTHROID) 88 MCG tablet Take 88 mcg by mouth daily before breakfast.    OVER THE COUNTER MEDICATION Take 1 capsule by mouth daily. OMEGA RED      STOP taking these medications     lisinopril (PRINIVIL,ZESTRIL) 10 MG tablet        No Known Allergies    The results of significant diagnostics from this hospitalization (including imaging, microbiology, ancillary and laboratory) are listed below for reference.    Significant Diagnostic Studies: Dg Chest 2 View  11/28/2013   CLINICAL DATA:  Cough,  congestion  EXAM: CHEST  2 VIEW  COMPARISON:  11/27/2013  FINDINGS: Cardiomediastinal silhouette is stable. There is small bilateral pleural effusion with bilateral basilar atelectasis or infiltrate. No pulmonary edema. Again noted compression fracture of T12 vertebral body.  IMPRESSION: Small bilateral pleural effusion with bilateral basilar atelectasis or infiltrate. No pulmonary edema.   Electronically Signed   By: Lahoma Crocker M.D.   On: 11/28/2013 11:44   US Renal  11/29/2013   CLINICAL DATA:  Acute renal failure  EXAM: RENAL/URINARY TRACT ULTRASOUND COMPLETE  COMPARISON:  CT abdomen pelvis November 27, 2013  FINDINGS: Right Kidney:  Length: 10.1 cm. Echogenicity within normal limits. There is mild cortical thinning. No mass or hydronephrosis visualized.  Left Kidney:  Length: 11.1 cm. Echogenicity within normal limits. There is mild cortical thinning. No  mass or hydronephrosis visualized.  Bladder:  Foley catheter is identified in the decompressed bladder.  There are bilateral pleural effusions.  IMPRESSION: Mild cortical thinning of bilateral kidneys. No hydronephrosis is noted bilaterally. Incidental finding of bilateral pleural effusions.   Electronically Signed   By: Abelardo Diesel M.D.   On: 11/29/2013 17:00    Microbiology: Recent Results (from the past 240 hour(s))  MRSA PCR SCREENING     Status: None   Collection Time    11/27/13  6:30 PM      Result Value Ref Range Status   MRSA by PCR NEGATIVE  NEGATIVE Final   Comment:            The GeneXpert MRSA Assay (FDA     approved for NASAL specimens     only), is one component of a     comprehensive MRSA colonization     surveillance program. It is not     intended to diagnose MRSA     infection nor to guide or     monitor treatment for     MRSA infections.  CULTURE, BLOOD (ROUTINE X 2)     Status: None   Collection Time    11/27/13 10:25 PM      Result Value Ref Range Status   Specimen Description BLOOD RIGHT ARM   Final   Special  Requests BOTTLES DRAWN AEROBIC AND ANAEROBIC 10CC   Final   Culture  Setup Time     Final   Value: 11/28/2013 03:19     Performed at [facility]   Culture     Final   Value:        BLOOD CULTURE RECEIVED NO GROWTH TO DATE CULTURE WILL BE HELD FOR 5 DAYS BEFORE ISSUING A FINAL NEGATIVE REPORT     Performed at [facility]   Report Status PENDING   Incomplete  CULTURE, BLOOD (ROUTINE X 2)     Status: None   Collection Time    11/27/13 10:35 PM      Result Value Ref Range Status   Specimen Description BLOOD RIGHT HAND   Final   Special Requests BOTTLES DRAWN AEROBIC AND ANAEROBIC 10CC   Final   Culture  Setup Time     Final   Value: 11/28/2013 03:20     Performed at [facility]   Culture     Final   Value:        BLOOD CULTURE RECEIVED NO GROWTH TO DATE CULTURE WILL BE HELD FOR 5 DAYS BEFORE ISSUING A FINAL NEGATIVE REPORT     Performed at [facility]   Report Status PENDING   Incomplete  URINE CULTURE     Status: None   Collection Time    11/29/13  2:40 AM      Result Value Ref Range Status   Specimen Description URINE, CATHETERIZED   Final   Special Requests NONE   Final   Culture  Setup Time     Final   Value: 11/29/2013 14:03     Performed at [place]     Final   Value: NO GROWTH     Performed at [facility]   Culture     Final   Value: NO GROWTH     Performed at [facility]   Report Status 11/30/2013 FINAL   Final     Labs: Basic Metabolic Panel:  Recent Labs Lab 11/27/13  2100 11/28/13 0225 11/29/13 0240  NA 141 139 138  K 4.6 4.4 4.3  CL 102 103 103  CO2 20 21 18*  GLUCOSE 102* 107* 148*  BUN 52* 42* 23  CREATININE 6.26* 4.72* 1.65*  CALCIUM 9.0 8.8 8.8  MG  --  2.0 1.9  PHOS  --  3.7  3.6  --    Liver Function Tests:  Recent Labs Lab 11/28/13 0225 11/29/13 0240  AST  --  24  ALT  --  18  ALKPHOS  --  119*  BILITOT  --  0.3  PROT  --  7.0  ALBUMIN 3.0* 2.9*   No  results found for this basename: LIPASE, AMYLASE,  in the last 168 hours No results found for this basename: AMMONIA,  in the last 168 hours CBC:  Recent Labs Lab 11/28/13 0225 11/29/13 0240  WBC 8.7 9.4  NEUTROABS  --  8.7*  HGB 11.1* 11.6*  HCT 33.0* 34.3*  MCV 87.3 88.2  PLT 222 214   Cardiac Enzymes:  Recent Labs Lab 11/27/13 2100  TROPONINI <0.30   BNP: BNP (last 3 results) No results found for this basename: PROBNP,  in the last 8760 hours CBG: No results found for this basename: GLUCAP,  in the last 168 hours     Signed:  Eliseo Squires, JESSICA  [facility] 11/30/2013, 1:25 PM

## 2013-11-30 NOTE — Progress Notes (Signed)
Patient ID: Derrick Odonnell, male   DOB: 1926-11-18, 80 y.o.   MRN: [id number]    Subjective:  Denies SSCP, palpitations or Dyspnea   Objective:  Filed Vitals:   11/29/13 1759 11/29/13 2022 11/30/13 0238 11/30/13 0516  BP: 136/83 136/66  158/77  Pulse: 66 75  82  Temp: 97.5 F (36.4 C) 98 F (36.7 C)  99.2 F (37.3 C)  TempSrc: Oral     Resp: 18 18  18   Height:      Weight:  151 lb (68.493 kg) 151 lb (68.493 kg)   SpO2: 97% 95%  94%    Intake/Output from previous day:  Intake/Output Summary (Last 24 hours) at 11/30/13 0905 Last data filed at 11/30/13 0500  Gross per 24 hour  Intake   1100 ml  Output   2275 ml  Net  -1175 ml    Physical Exam: Affect appropriate Healthy:  appears stated age HEENT: normal Neck supple with no adenopathy JVP normal no bruits no thyromegaly Lungs clear with no wheezing and good diaphragmatic motion Heart:  S1/S2 no murmur, no rub, gallop or click PMI normal Abdomen: benighn, BS positve, no tenderness, no AAA no bruit.  No HSM or HJR Distal pulses intact with no bruits No edema Neuro non-focal Skin warm and dry No muscular weakness Foley with clear urine   Lab Results: Basic Metabolic Panel:  Recent Labs  11/27/13 2100 11/28/13 0225 11/29/13 0240  NA 141 139 138  K 4.6 4.4 4.3  CL 102 103 103  CO2 20 21 18*  GLUCOSE 102* 107* 148*  BUN 52* 42* 23  CREATININE 6.26* 4.72* 1.65*  CALCIUM 9.0 8.8 8.8  MG  --  2.0 1.9  PHOS  --  3.7  3.6  --    Liver Function Tests:  Recent Labs  11/28/13 0225 11/29/13 0240  AST  --  24  ALT  --  18  ALKPHOS  --  119*  BILITOT  --  0.3  PROT  --  7.0  ALBUMIN 3.0* 2.9*   CBC:  Recent Labs  11/28/13 0225 11/29/13 0240  WBC 8.7 9.4  NEUTROABS  --  8.7*  HGB 11.1* 11.6*  HCT 33.0* 34.3*  MCV 87.3 88.2  PLT 222 214   Cardiac Enzymes:  Recent Labs  11/27/13 2100  TROPONINI <0.30   Hemoglobin A1C:  Recent Labs  11/28/13 0225  HGBA1C 5.5   Thyroid Function  Tests:  Recent Labs  11/28/13 0225  TSH 2.780    Imaging: Dg Chest 2 View  11/28/2013   CLINICAL DATA:  Cough, congestion  EXAM: CHEST  2 VIEW  COMPARISON:  11/27/2013  FINDINGS: Cardiomediastinal silhouette is stable. There is small bilateral pleural effusion with bilateral basilar atelectasis or infiltrate. No pulmonary edema. Again noted compression fracture of T12 vertebral body.  IMPRESSION: Small bilateral pleural effusion with bilateral basilar atelectasis or infiltrate. No pulmonary edema.   Electronically Signed   By: Lahoma Crocker M.D.   On: 11/28/2013 11:44   US Renal  11/29/2013   CLINICAL DATA:  Acute renal failure  EXAM: RENAL/URINARY TRACT ULTRASOUND COMPLETE  COMPARISON:  CT abdomen pelvis November 27, 2013  FINDINGS: Right Kidney:  Length: 10.1 cm. Echogenicity within normal limits. There is mild cortical thinning. No mass or hydronephrosis visualized.  Left Kidney:  Length: 11.1 cm. Echogenicity within normal limits. There is mild cortical thinning. No mass or hydronephrosis visualized.  Bladder:  Foley catheter is identified in the decompressed bladder.  There are bilateral pleural effusions.  IMPRESSION: Mild cortical thinning of bilateral kidneys. No hydronephrosis is noted bilaterally. Incidental finding of bilateral pleural effusions.   Electronically Signed   By: Abelardo Diesel M.D.   On: 11/29/2013 17:00    Cardiac Studies:  ECG: SR PVC normal QT    Echo: EF 50-55%  Medications:   . azithromycin  500 mg Oral Daily  . cefTRIAXone (ROCEPHIN) IVPB 1 gram/50 mL D5W  1 g Intravenous Q24H  . diltiazem  60 mg Oral Q12H  . levothyroxine  88 mcg Oral QAC breakfast  . methylPREDNISolone (SOLU-MEDROL) injection  60 mg Intravenous Q24H  . pantoprazole  40 mg Oral Q1200  . pneumococcal 23 valent vaccine  0.5 mL Intramuscular Tomorrow-1000  . sodium chloride  3 mL Intravenous Q12H     . sodium chloride Stopped (11/29/13 1650)    Assessment/Plan:  PAF:  Brief see note form  Dr Harrington Challenger no anticoagulation for now  Cr improved  And urine clean with no hematuria change cardizem to daily LA  [name] 11/30/2013, 9:05 AM

## 2013-12-04 LAB — CULTURE, BLOOD (ROUTINE X 2)
CULTURE: NO GROWTH
Culture: NO GROWTH

## 2014-12-03 DIAGNOSIS — Z23 Encounter for immunization: Secondary | ICD-10-CM | POA: Diagnosis not present

## 2014-12-21 DIAGNOSIS — R339 Retention of urine, unspecified: Secondary | ICD-10-CM | POA: Diagnosis not present

## 2014-12-21 DIAGNOSIS — C61 Malignant neoplasm of prostate: Secondary | ICD-10-CM | POA: Diagnosis not present

## 2014-12-21 DIAGNOSIS — R350 Frequency of micturition: Secondary | ICD-10-CM | POA: Diagnosis not present

## 2015-01-17 DIAGNOSIS — E039 Hypothyroidism, unspecified: Secondary | ICD-10-CM | POA: Diagnosis not present

## 2015-01-24 DIAGNOSIS — E785 Hyperlipidemia, unspecified: Secondary | ICD-10-CM | POA: Diagnosis not present

## 2015-01-24 DIAGNOSIS — Z6821 Body mass index (BMI) 21.0-21.9, adult: Secondary | ICD-10-CM | POA: Diagnosis not present

## 2015-01-24 DIAGNOSIS — R636 Underweight: Secondary | ICD-10-CM | POA: Diagnosis not present

## 2015-01-24 DIAGNOSIS — N401 Enlarged prostate with lower urinary tract symptoms: Secondary | ICD-10-CM | POA: Diagnosis not present

## 2015-01-24 DIAGNOSIS — E039 Hypothyroidism, unspecified: Secondary | ICD-10-CM | POA: Diagnosis not present

## 2015-01-24 DIAGNOSIS — I480 Paroxysmal atrial fibrillation: Secondary | ICD-10-CM | POA: Diagnosis not present

## 2015-01-24 DIAGNOSIS — I10 Essential (primary) hypertension: Secondary | ICD-10-CM | POA: Diagnosis not present

## 2015-04-12 DIAGNOSIS — Z6821 Body mass index (BMI) 21.0-21.9, adult: Secondary | ICD-10-CM | POA: Diagnosis not present

## 2015-04-12 DIAGNOSIS — R634 Abnormal weight loss: Secondary | ICD-10-CM | POA: Diagnosis not present

## 2015-04-12 DIAGNOSIS — R1013 Epigastric pain: Secondary | ICD-10-CM | POA: Diagnosis not present

## 2015-05-10 DIAGNOSIS — Z6821 Body mass index (BMI) 21.0-21.9, adult: Secondary | ICD-10-CM | POA: Diagnosis not present

## 2015-05-10 DIAGNOSIS — R5381 Other malaise: Secondary | ICD-10-CM | POA: Diagnosis not present

## 2015-05-10 DIAGNOSIS — J309 Allergic rhinitis, unspecified: Secondary | ICD-10-CM | POA: Diagnosis not present

## 2015-05-10 DIAGNOSIS — E538 Deficiency of other specified B group vitamins: Secondary | ICD-10-CM | POA: Diagnosis not present

## 2015-06-21 DIAGNOSIS — C61 Malignant neoplasm of prostate: Secondary | ICD-10-CM | POA: Diagnosis not present

## 2015-06-29 DIAGNOSIS — I10 Essential (primary) hypertension: Secondary | ICD-10-CM | POA: Diagnosis not present

## 2015-06-29 DIAGNOSIS — Z6821 Body mass index (BMI) 21.0-21.9, adult: Secondary | ICD-10-CM | POA: Diagnosis not present

## 2015-06-29 DIAGNOSIS — R5381 Other malaise: Secondary | ICD-10-CM | POA: Diagnosis not present

## 2015-09-20 DIAGNOSIS — R350 Frequency of micturition: Secondary | ICD-10-CM | POA: Diagnosis not present

## 2015-09-20 DIAGNOSIS — C61 Malignant neoplasm of prostate: Secondary | ICD-10-CM | POA: Diagnosis not present

## 2015-11-08 DIAGNOSIS — H25811 Combined forms of age-related cataract, right eye: Secondary | ICD-10-CM | POA: Diagnosis not present

## 2015-11-28 DIAGNOSIS — I10 Essential (primary) hypertension: Secondary | ICD-10-CM | POA: Diagnosis not present

## 2015-11-28 DIAGNOSIS — I480 Paroxysmal atrial fibrillation: Secondary | ICD-10-CM | POA: Diagnosis not present

## 2015-11-28 DIAGNOSIS — E039 Hypothyroidism, unspecified: Secondary | ICD-10-CM | POA: Diagnosis not present

## 2015-11-28 DIAGNOSIS — Z23 Encounter for immunization: Secondary | ICD-10-CM | POA: Diagnosis not present

## 2015-11-29 DIAGNOSIS — H25811 Combined forms of age-related cataract, right eye: Secondary | ICD-10-CM | POA: Diagnosis not present

## 2015-11-29 DIAGNOSIS — E039 Hypothyroidism, unspecified: Secondary | ICD-10-CM | POA: Diagnosis not present

## 2015-11-29 DIAGNOSIS — Z7982 Long term (current) use of aspirin: Secondary | ICD-10-CM | POA: Diagnosis not present

## 2015-11-29 DIAGNOSIS — Z79899 Other long term (current) drug therapy: Secondary | ICD-10-CM | POA: Diagnosis not present

## 2015-11-29 DIAGNOSIS — I4891 Unspecified atrial fibrillation: Secondary | ICD-10-CM | POA: Diagnosis not present

## 2015-11-29 DIAGNOSIS — Z8546 Personal history of malignant neoplasm of prostate: Secondary | ICD-10-CM | POA: Diagnosis not present

## 2015-11-29 DIAGNOSIS — K219 Gastro-esophageal reflux disease without esophagitis: Secondary | ICD-10-CM | POA: Diagnosis not present

## 2015-11-29 DIAGNOSIS — Z87891 Personal history of nicotine dependence: Secondary | ICD-10-CM | POA: Diagnosis not present

## 2015-11-29 DIAGNOSIS — I10 Essential (primary) hypertension: Secondary | ICD-10-CM | POA: Diagnosis not present

## 2015-11-29 DIAGNOSIS — H259 Unspecified age-related cataract: Secondary | ICD-10-CM | POA: Diagnosis not present

## 2015-12-07 DIAGNOSIS — C44212 Basal cell carcinoma of skin of right ear and external auricular canal: Secondary | ICD-10-CM | POA: Diagnosis not present

## 2015-12-13 DIAGNOSIS — E039 Hypothyroidism, unspecified: Secondary | ICD-10-CM | POA: Diagnosis not present

## 2015-12-13 DIAGNOSIS — I10 Essential (primary) hypertension: Secondary | ICD-10-CM | POA: Diagnosis not present

## 2015-12-13 DIAGNOSIS — I4891 Unspecified atrial fibrillation: Secondary | ICD-10-CM | POA: Diagnosis not present

## 2015-12-13 DIAGNOSIS — K219 Gastro-esophageal reflux disease without esophagitis: Secondary | ICD-10-CM | POA: Diagnosis not present

## 2015-12-13 DIAGNOSIS — H25812 Combined forms of age-related cataract, left eye: Secondary | ICD-10-CM | POA: Diagnosis not present

## 2015-12-13 DIAGNOSIS — M1990 Unspecified osteoarthritis, unspecified site: Secondary | ICD-10-CM | POA: Diagnosis not present

## 2015-12-13 DIAGNOSIS — Z8546 Personal history of malignant neoplasm of prostate: Secondary | ICD-10-CM | POA: Diagnosis not present

## 2015-12-13 DIAGNOSIS — Z7982 Long term (current) use of aspirin: Secondary | ICD-10-CM | POA: Diagnosis not present

## 2015-12-13 DIAGNOSIS — Z87891 Personal history of nicotine dependence: Secondary | ICD-10-CM | POA: Diagnosis not present

## 2015-12-13 DIAGNOSIS — Z79899 Other long term (current) drug therapy: Secondary | ICD-10-CM | POA: Diagnosis not present

## 2015-12-16 DIAGNOSIS — C44212 Basal cell carcinoma of skin of right ear and external auricular canal: Secondary | ICD-10-CM | POA: Diagnosis not present

## 2015-12-29 DIAGNOSIS — R972 Elevated prostate specific antigen [PSA]: Secondary | ICD-10-CM | POA: Diagnosis not present

## 2015-12-29 DIAGNOSIS — C61 Malignant neoplasm of prostate: Secondary | ICD-10-CM | POA: Diagnosis not present

## 2016-03-16 DIAGNOSIS — C44222 Squamous cell carcinoma of skin of right ear and external auricular canal: Secondary | ICD-10-CM | POA: Diagnosis not present

## 2016-03-16 DIAGNOSIS — L570 Actinic keratosis: Secondary | ICD-10-CM | POA: Diagnosis not present

## 2016-04-03 DIAGNOSIS — C61 Malignant neoplasm of prostate: Secondary | ICD-10-CM | POA: Diagnosis not present

## 2016-04-03 DIAGNOSIS — I10 Essential (primary) hypertension: Secondary | ICD-10-CM | POA: Diagnosis not present

## 2016-04-03 DIAGNOSIS — Z1389 Encounter for screening for other disorder: Secondary | ICD-10-CM | POA: Diagnosis not present

## 2016-04-03 DIAGNOSIS — Z9181 History of falling: Secondary | ICD-10-CM | POA: Diagnosis not present

## 2016-04-03 DIAGNOSIS — E785 Hyperlipidemia, unspecified: Secondary | ICD-10-CM | POA: Diagnosis not present

## 2016-04-03 DIAGNOSIS — E039 Hypothyroidism, unspecified: Secondary | ICD-10-CM | POA: Diagnosis not present

## 2016-05-28 DIAGNOSIS — H532 Diplopia: Secondary | ICD-10-CM | POA: Diagnosis not present

## 2016-05-28 DIAGNOSIS — H353131 Nonexudative age-related macular degeneration, bilateral, early dry stage: Secondary | ICD-10-CM | POA: Diagnosis not present

## 2016-06-18 DIAGNOSIS — R972 Elevated prostate specific antigen [PSA]: Secondary | ICD-10-CM | POA: Diagnosis not present

## 2016-06-26 DIAGNOSIS — C61 Malignant neoplasm of prostate: Secondary | ICD-10-CM | POA: Diagnosis not present

## 2016-08-01 DIAGNOSIS — N401 Enlarged prostate with lower urinary tract symptoms: Secondary | ICD-10-CM | POA: Diagnosis not present

## 2016-08-01 DIAGNOSIS — Z6820 Body mass index (BMI) 20.0-20.9, adult: Secondary | ICD-10-CM | POA: Diagnosis not present

## 2016-08-01 DIAGNOSIS — I10 Essential (primary) hypertension: Secondary | ICD-10-CM | POA: Diagnosis not present

## 2016-08-01 DIAGNOSIS — R972 Elevated prostate specific antigen [PSA]: Secondary | ICD-10-CM | POA: Diagnosis not present

## 2016-08-01 DIAGNOSIS — I480 Paroxysmal atrial fibrillation: Secondary | ICD-10-CM | POA: Diagnosis not present

## 2016-08-27 DIAGNOSIS — H353131 Nonexudative age-related macular degeneration, bilateral, early dry stage: Secondary | ICD-10-CM | POA: Diagnosis not present

## 2016-08-27 DIAGNOSIS — H26492 Other secondary cataract, left eye: Secondary | ICD-10-CM | POA: Diagnosis not present

## 2016-09-13 DIAGNOSIS — R972 Elevated prostate specific antigen [PSA]: Secondary | ICD-10-CM | POA: Diagnosis not present

## 2016-09-27 DIAGNOSIS — R339 Retention of urine, unspecified: Secondary | ICD-10-CM | POA: Diagnosis not present

## 2016-09-27 DIAGNOSIS — C61 Malignant neoplasm of prostate: Secondary | ICD-10-CM | POA: Diagnosis not present

## 2016-12-05 DIAGNOSIS — Z23 Encounter for immunization: Secondary | ICD-10-CM | POA: Diagnosis not present

## 2016-12-05 DIAGNOSIS — R972 Elevated prostate specific antigen [PSA]: Secondary | ICD-10-CM | POA: Diagnosis not present

## 2016-12-05 DIAGNOSIS — E039 Hypothyroidism, unspecified: Secondary | ICD-10-CM | POA: Diagnosis not present

## 2016-12-05 DIAGNOSIS — I10 Essential (primary) hypertension: Secondary | ICD-10-CM | POA: Diagnosis not present

## 2016-12-05 DIAGNOSIS — I480 Paroxysmal atrial fibrillation: Secondary | ICD-10-CM | POA: Diagnosis not present

## 2016-12-05 DIAGNOSIS — C61 Malignant neoplasm of prostate: Secondary | ICD-10-CM | POA: Diagnosis not present

## 2016-12-25 DIAGNOSIS — R972 Elevated prostate specific antigen [PSA]: Secondary | ICD-10-CM | POA: Diagnosis not present

## 2017-01-03 DIAGNOSIS — C61 Malignant neoplasm of prostate: Secondary | ICD-10-CM | POA: Diagnosis not present

## 2017-01-17 DIAGNOSIS — Z23 Encounter for immunization: Secondary | ICD-10-CM | POA: Diagnosis not present

## 2017-01-17 DIAGNOSIS — Z125 Encounter for screening for malignant neoplasm of prostate: Secondary | ICD-10-CM | POA: Diagnosis not present

## 2017-01-17 DIAGNOSIS — Z1331 Encounter for screening for depression: Secondary | ICD-10-CM | POA: Diagnosis not present

## 2017-01-17 DIAGNOSIS — E785 Hyperlipidemia, unspecified: Secondary | ICD-10-CM | POA: Diagnosis not present

## 2017-01-17 DIAGNOSIS — Z9181 History of falling: Secondary | ICD-10-CM | POA: Diagnosis not present

## 2017-01-17 DIAGNOSIS — Z0000 Encounter for general adult medical examination without abnormal findings: Secondary | ICD-10-CM | POA: Diagnosis not present

## 2017-01-17 DIAGNOSIS — Z136 Encounter for screening for cardiovascular disorders: Secondary | ICD-10-CM | POA: Diagnosis not present

## 2017-04-02 DIAGNOSIS — C61 Malignant neoplasm of prostate: Secondary | ICD-10-CM | POA: Diagnosis not present

## 2017-04-09 DIAGNOSIS — C61 Malignant neoplasm of prostate: Secondary | ICD-10-CM | POA: Diagnosis not present

## 2017-04-17 DIAGNOSIS — Z1331 Encounter for screening for depression: Secondary | ICD-10-CM | POA: Diagnosis not present

## 2017-04-17 DIAGNOSIS — Z139 Encounter for screening, unspecified: Secondary | ICD-10-CM | POA: Diagnosis not present

## 2017-04-17 DIAGNOSIS — C61 Malignant neoplasm of prostate: Secondary | ICD-10-CM | POA: Diagnosis not present

## 2017-04-17 DIAGNOSIS — E039 Hypothyroidism, unspecified: Secondary | ICD-10-CM | POA: Diagnosis not present

## 2017-04-17 DIAGNOSIS — I480 Paroxysmal atrial fibrillation: Secondary | ICD-10-CM | POA: Diagnosis not present

## 2017-06-21 DIAGNOSIS — H3560 Retinal hemorrhage, unspecified eye: Secondary | ICD-10-CM | POA: Diagnosis not present

## 2017-06-21 DIAGNOSIS — H52223 Regular astigmatism, bilateral: Secondary | ICD-10-CM | POA: Diagnosis not present

## 2017-06-21 DIAGNOSIS — H524 Presbyopia: Secondary | ICD-10-CM | POA: Diagnosis not present

## 2017-06-21 DIAGNOSIS — H5212 Myopia, left eye: Secondary | ICD-10-CM | POA: Diagnosis not present

## 2017-06-21 DIAGNOSIS — Z961 Presence of intraocular lens: Secondary | ICD-10-CM | POA: Diagnosis not present

## 2017-06-21 DIAGNOSIS — H353131 Nonexudative age-related macular degeneration, bilateral, early dry stage: Secondary | ICD-10-CM | POA: Diagnosis not present

## 2017-06-21 DIAGNOSIS — H26491 Other secondary cataract, right eye: Secondary | ICD-10-CM | POA: Diagnosis not present

## 2017-06-21 DIAGNOSIS — H26492 Other secondary cataract, left eye: Secondary | ICD-10-CM | POA: Diagnosis not present

## 2017-06-21 DIAGNOSIS — H5201 Hypermetropia, right eye: Secondary | ICD-10-CM | POA: Diagnosis not present

## 2017-06-28 DIAGNOSIS — C61 Malignant neoplasm of prostate: Secondary | ICD-10-CM | POA: Diagnosis not present

## 2017-07-09 DIAGNOSIS — C61 Malignant neoplasm of prostate: Secondary | ICD-10-CM | POA: Diagnosis not present

## 2017-09-02 DIAGNOSIS — E538 Deficiency of other specified B group vitamins: Secondary | ICD-10-CM | POA: Diagnosis not present

## 2017-09-02 DIAGNOSIS — R634 Abnormal weight loss: Secondary | ICD-10-CM | POA: Diagnosis not present

## 2017-09-02 DIAGNOSIS — I10 Essential (primary) hypertension: Secondary | ICD-10-CM | POA: Diagnosis not present

## 2017-09-02 DIAGNOSIS — E039 Hypothyroidism, unspecified: Secondary | ICD-10-CM | POA: Diagnosis not present

## 2017-09-02 DIAGNOSIS — Z1339 Encounter for screening examination for other mental health and behavioral disorders: Secondary | ICD-10-CM | POA: Diagnosis not present

## 2017-09-02 DIAGNOSIS — I480 Paroxysmal atrial fibrillation: Secondary | ICD-10-CM | POA: Diagnosis not present

## 2017-09-02 DIAGNOSIS — C61 Malignant neoplasm of prostate: Secondary | ICD-10-CM | POA: Diagnosis not present

## 2017-09-02 DIAGNOSIS — R5381 Other malaise: Secondary | ICD-10-CM | POA: Diagnosis not present

## 2017-09-09 DIAGNOSIS — E538 Deficiency of other specified B group vitamins: Secondary | ICD-10-CM | POA: Diagnosis not present

## 2017-09-16 DIAGNOSIS — E538 Deficiency of other specified B group vitamins: Secondary | ICD-10-CM | POA: Diagnosis not present

## 2017-09-23 DIAGNOSIS — E538 Deficiency of other specified B group vitamins: Secondary | ICD-10-CM | POA: Diagnosis not present

## 2017-10-25 DIAGNOSIS — E538 Deficiency of other specified B group vitamins: Secondary | ICD-10-CM | POA: Diagnosis not present

## 2018-01-06 DIAGNOSIS — R634 Abnormal weight loss: Secondary | ICD-10-CM | POA: Diagnosis not present

## 2018-01-06 DIAGNOSIS — E039 Hypothyroidism, unspecified: Secondary | ICD-10-CM | POA: Diagnosis not present

## 2018-01-06 DIAGNOSIS — Z6820 Body mass index (BMI) 20.0-20.9, adult: Secondary | ICD-10-CM | POA: Diagnosis not present

## 2018-01-06 DIAGNOSIS — I480 Paroxysmal atrial fibrillation: Secondary | ICD-10-CM | POA: Diagnosis not present

## 2018-01-06 DIAGNOSIS — I10 Essential (primary) hypertension: Secondary | ICD-10-CM | POA: Diagnosis not present

## 2018-01-06 DIAGNOSIS — C61 Malignant neoplasm of prostate: Secondary | ICD-10-CM | POA: Diagnosis not present

## 2018-01-06 DIAGNOSIS — R5381 Other malaise: Secondary | ICD-10-CM | POA: Diagnosis not present

## 2018-01-06 DIAGNOSIS — Z23 Encounter for immunization: Secondary | ICD-10-CM | POA: Diagnosis not present

## 2018-04-01 DIAGNOSIS — C61 Malignant neoplasm of prostate: Secondary | ICD-10-CM | POA: Diagnosis not present

## 2018-05-08 DIAGNOSIS — R634 Abnormal weight loss: Secondary | ICD-10-CM | POA: Diagnosis not present

## 2018-05-08 DIAGNOSIS — Z6821 Body mass index (BMI) 21.0-21.9, adult: Secondary | ICD-10-CM | POA: Diagnosis not present

## 2018-05-08 DIAGNOSIS — C61 Malignant neoplasm of prostate: Secondary | ICD-10-CM | POA: Diagnosis not present

## 2018-05-08 DIAGNOSIS — I10 Essential (primary) hypertension: Secondary | ICD-10-CM | POA: Diagnosis not present

## 2018-05-08 DIAGNOSIS — I480 Paroxysmal atrial fibrillation: Secondary | ICD-10-CM | POA: Diagnosis not present

## 2018-05-08 DIAGNOSIS — E039 Hypothyroidism, unspecified: Secondary | ICD-10-CM | POA: Diagnosis not present

## 2018-05-08 DIAGNOSIS — R5381 Other malaise: Secondary | ICD-10-CM | POA: Diagnosis not present

## 2018-09-30 DIAGNOSIS — C61 Malignant neoplasm of prostate: Secondary | ICD-10-CM | POA: Diagnosis not present

## 2018-10-07 DIAGNOSIS — C61 Malignant neoplasm of prostate: Secondary | ICD-10-CM | POA: Diagnosis not present

## 2019-01-01 DIAGNOSIS — R1312 Dysphagia, oropharyngeal phase: Secondary | ICD-10-CM | POA: Diagnosis not present

## 2019-01-01 DIAGNOSIS — Z23 Encounter for immunization: Secondary | ICD-10-CM | POA: Diagnosis not present

## 2019-01-01 DIAGNOSIS — I480 Paroxysmal atrial fibrillation: Secondary | ICD-10-CM | POA: Diagnosis not present

## 2019-01-01 DIAGNOSIS — I10 Essential (primary) hypertension: Secondary | ICD-10-CM | POA: Diagnosis not present

## 2019-01-01 DIAGNOSIS — Z139 Encounter for screening, unspecified: Secondary | ICD-10-CM | POA: Diagnosis not present

## 2019-01-01 DIAGNOSIS — C61 Malignant neoplasm of prostate: Secondary | ICD-10-CM | POA: Diagnosis not present

## 2019-01-01 DIAGNOSIS — R634 Abnormal weight loss: Secondary | ICD-10-CM | POA: Diagnosis not present

## 2019-01-01 DIAGNOSIS — Z6820 Body mass index (BMI) 20.0-20.9, adult: Secondary | ICD-10-CM | POA: Diagnosis not present

## 2019-01-08 DIAGNOSIS — R338 Other retention of urine: Secondary | ICD-10-CM | POA: Diagnosis not present

## 2019-01-08 DIAGNOSIS — C61 Malignant neoplasm of prostate: Secondary | ICD-10-CM | POA: Diagnosis not present

## 2019-01-14 DIAGNOSIS — Z1331 Encounter for screening for depression: Secondary | ICD-10-CM | POA: Diagnosis not present

## 2019-01-14 DIAGNOSIS — E785 Hyperlipidemia, unspecified: Secondary | ICD-10-CM | POA: Diagnosis not present

## 2019-01-14 DIAGNOSIS — Z9181 History of falling: Secondary | ICD-10-CM | POA: Diagnosis not present

## 2019-01-14 DIAGNOSIS — Z0000 Encounter for general adult medical examination without abnormal findings: Secondary | ICD-10-CM | POA: Diagnosis not present

## 2019-01-26 DIAGNOSIS — R1310 Dysphagia, unspecified: Secondary | ICD-10-CM | POA: Diagnosis not present

## 2019-01-28 DIAGNOSIS — K224 Dyskinesia of esophagus: Secondary | ICD-10-CM | POA: Diagnosis not present

## 2019-01-28 DIAGNOSIS — R1310 Dysphagia, unspecified: Secondary | ICD-10-CM | POA: Diagnosis not present

## 2019-02-06 DIAGNOSIS — R1310 Dysphagia, unspecified: Secondary | ICD-10-CM | POA: Diagnosis not present

## 2019-02-06 DIAGNOSIS — K449 Diaphragmatic hernia without obstruction or gangrene: Secondary | ICD-10-CM | POA: Diagnosis not present

## 2019-02-06 DIAGNOSIS — K225 Diverticulum of esophagus, acquired: Secondary | ICD-10-CM | POA: Diagnosis not present

## 2019-02-06 DIAGNOSIS — K222 Esophageal obstruction: Secondary | ICD-10-CM | POA: Diagnosis not present

## 2019-03-16 DIAGNOSIS — K222 Esophageal obstruction: Secondary | ICD-10-CM | POA: Diagnosis not present

## 2019-04-14 DIAGNOSIS — Z6820 Body mass index (BMI) 20.0-20.9, adult: Secondary | ICD-10-CM | POA: Diagnosis not present

## 2019-04-14 DIAGNOSIS — E785 Hyperlipidemia, unspecified: Secondary | ICD-10-CM | POA: Diagnosis not present

## 2019-04-14 DIAGNOSIS — I10 Essential (primary) hypertension: Secondary | ICD-10-CM | POA: Diagnosis not present

## 2019-04-14 DIAGNOSIS — R1312 Dysphagia, oropharyngeal phase: Secondary | ICD-10-CM | POA: Diagnosis not present

## 2019-04-14 DIAGNOSIS — R634 Abnormal weight loss: Secondary | ICD-10-CM | POA: Diagnosis not present

## 2019-04-14 DIAGNOSIS — E039 Hypothyroidism, unspecified: Secondary | ICD-10-CM | POA: Diagnosis not present

## 2019-04-14 DIAGNOSIS — E538 Deficiency of other specified B group vitamins: Secondary | ICD-10-CM | POA: Diagnosis not present

## 2019-04-14 DIAGNOSIS — C61 Malignant neoplasm of prostate: Secondary | ICD-10-CM | POA: Diagnosis not present

## 2019-04-14 DIAGNOSIS — I480 Paroxysmal atrial fibrillation: Secondary | ICD-10-CM | POA: Diagnosis not present

## 2019-05-06 DIAGNOSIS — H02403 Unspecified ptosis of bilateral eyelids: Secondary | ICD-10-CM | POA: Diagnosis not present

## 2019-05-06 DIAGNOSIS — H353131 Nonexudative age-related macular degeneration, bilateral, early dry stage: Secondary | ICD-10-CM | POA: Diagnosis not present

## 2019-05-06 DIAGNOSIS — Z961 Presence of intraocular lens: Secondary | ICD-10-CM | POA: Diagnosis not present

## 2019-05-06 DIAGNOSIS — H532 Diplopia: Secondary | ICD-10-CM | POA: Diagnosis not present

## 2019-05-20 DIAGNOSIS — H524 Presbyopia: Secondary | ICD-10-CM | POA: Diagnosis not present

## 2019-06-11 ENCOUNTER — Emergency Department (HOSPITAL_COMMUNITY): Payer: Medicare HMO

## 2019-06-11 ENCOUNTER — Inpatient Hospital Stay (HOSPITAL_COMMUNITY)
Admission: EM | Admit: 2019-06-11 | Discharge: 2019-06-13 | DRG: 494 | Disposition: A | Discharge status: Home / Self Care | Payer: Medicare HMO | Attending: Internal Medicine | Admitting: Internal Medicine

## 2019-06-11 ENCOUNTER — Encounter (HOSPITAL_COMMUNITY): Payer: Self-pay | Admitting: Internal Medicine

## 2019-06-11 ENCOUNTER — Other Ambulatory Visit: Payer: Self-pay

## 2019-06-11 DIAGNOSIS — Z419 Encounter for procedure for purposes other than remedying health state, unspecified: Secondary | ICD-10-CM

## 2019-06-11 DIAGNOSIS — S42202D Unspecified fracture of upper end of left humerus, subsequent encounter for fracture with routine healing: Secondary | ICD-10-CM | POA: Diagnosis not present

## 2019-06-11 DIAGNOSIS — S42212A Unspecified displaced fracture of surgical neck of left humerus, initial encounter for closed fracture: Principal | ICD-10-CM | POA: Diagnosis present

## 2019-06-11 DIAGNOSIS — Z79890 Hormone replacement therapy (postmenopausal): Secondary | ICD-10-CM | POA: Diagnosis not present

## 2019-06-11 DIAGNOSIS — E785 Hyperlipidemia, unspecified: Secondary | ICD-10-CM | POA: Diagnosis not present

## 2019-06-11 DIAGNOSIS — W19XXXA Unspecified fall, initial encounter: Secondary | ICD-10-CM | POA: Diagnosis not present

## 2019-06-11 DIAGNOSIS — F17290 Nicotine dependence, other tobacco product, uncomplicated: Secondary | ICD-10-CM | POA: Diagnosis present

## 2019-06-11 DIAGNOSIS — S42201A Unspecified fracture of upper end of right humerus, initial encounter for closed fracture: Secondary | ICD-10-CM | POA: Diagnosis not present

## 2019-06-11 DIAGNOSIS — Z79899 Other long term (current) drug therapy: Secondary | ICD-10-CM

## 2019-06-11 DIAGNOSIS — S42302A Unspecified fracture of shaft of humerus, left arm, initial encounter for closed fracture: Secondary | ICD-10-CM | POA: Diagnosis present

## 2019-06-11 DIAGNOSIS — I480 Paroxysmal atrial fibrillation: Secondary | ICD-10-CM | POA: Diagnosis not present

## 2019-06-11 DIAGNOSIS — M25512 Pain in left shoulder: Secondary | ICD-10-CM | POA: Diagnosis not present

## 2019-06-11 DIAGNOSIS — Z833 Family history of diabetes mellitus: Secondary | ICD-10-CM

## 2019-06-11 DIAGNOSIS — E875 Hyperkalemia: Secondary | ICD-10-CM | POA: Diagnosis not present

## 2019-06-11 DIAGNOSIS — Z20822 Contact with and (suspected) exposure to covid-19: Secondary | ICD-10-CM | POA: Diagnosis not present

## 2019-06-11 DIAGNOSIS — S42295A Other nondisplaced fracture of upper end of left humerus, initial encounter for closed fracture: Secondary | ICD-10-CM | POA: Diagnosis not present

## 2019-06-11 DIAGNOSIS — S42292A Other displaced fracture of upper end of left humerus, initial encounter for closed fracture: Secondary | ICD-10-CM | POA: Diagnosis not present

## 2019-06-11 DIAGNOSIS — S59902A Unspecified injury of left elbow, initial encounter: Secondary | ICD-10-CM | POA: Diagnosis not present

## 2019-06-11 DIAGNOSIS — W010XXA Fall on same level from slipping, tripping and stumbling without subsequent striking against object, initial encounter: Secondary | ICD-10-CM | POA: Diagnosis present

## 2019-06-11 DIAGNOSIS — Z09 Encounter for follow-up examination after completed treatment for conditions other than malignant neoplasm: Secondary | ICD-10-CM

## 2019-06-11 DIAGNOSIS — Z7952 Long term (current) use of systemic steroids: Secondary | ICD-10-CM

## 2019-06-11 DIAGNOSIS — S299XXA Unspecified injury of thorax, initial encounter: Secondary | ICD-10-CM | POA: Diagnosis not present

## 2019-06-11 DIAGNOSIS — Z8052 Family history of malignant neoplasm of bladder: Secondary | ICD-10-CM

## 2019-06-11 DIAGNOSIS — E039 Hypothyroidism, unspecified: Secondary | ICD-10-CM | POA: Diagnosis present

## 2019-06-11 DIAGNOSIS — Z8546 Personal history of malignant neoplasm of prostate: Secondary | ICD-10-CM | POA: Diagnosis not present

## 2019-06-11 DIAGNOSIS — I10 Essential (primary) hypertension: Secondary | ICD-10-CM | POA: Diagnosis not present

## 2019-06-11 DIAGNOSIS — Z03818 Encounter for observation for suspected exposure to other biological agents ruled out: Secondary | ICD-10-CM | POA: Diagnosis not present

## 2019-06-11 DIAGNOSIS — S42292D Other displaced fracture of upper end of left humerus, subsequent encounter for fracture with routine healing: Secondary | ICD-10-CM | POA: Diagnosis not present

## 2019-06-11 DIAGNOSIS — R52 Pain, unspecified: Secondary | ICD-10-CM | POA: Diagnosis not present

## 2019-06-11 DIAGNOSIS — G8918 Other acute postprocedural pain: Secondary | ICD-10-CM | POA: Diagnosis not present

## 2019-06-11 DIAGNOSIS — I4891 Unspecified atrial fibrillation: Secondary | ICD-10-CM | POA: Diagnosis not present

## 2019-06-11 DIAGNOSIS — M25519 Pain in unspecified shoulder: Secondary | ICD-10-CM | POA: Diagnosis not present

## 2019-06-11 LAB — CBC
HCT: 38.9 % — ABNORMAL LOW (ref 39.0–52.0)
Hemoglobin: 12.6 g/dL — ABNORMAL LOW (ref 13.0–17.0)
MCH: 30 pg (ref 26.0–34.0)
MCHC: 32.4 g/dL (ref 30.0–36.0)
MCV: 92.6 fL (ref 80.0–100.0)
Platelets: 225 10*3/uL (ref 150–400)
RBC: 4.2 MIL/uL — ABNORMAL LOW (ref 4.22–5.81)
RDW: 12.3 % (ref 11.5–15.5)
WBC: 9.6 10*3/uL (ref 4.0–10.5)
nRBC: 0 % (ref 0.0–0.2)

## 2019-06-11 LAB — BASIC METABOLIC PANEL
Anion gap: 14 (ref 5–15)
BUN: 17 mg/dL (ref 8–23)
CO2: 23 mmol/L (ref 22–32)
Calcium: 8.9 mg/dL (ref 8.9–10.3)
Chloride: 100 mmol/L (ref 98–111)
Creatinine, Ser: 1.09 mg/dL (ref 0.61–1.24)
GFR calc Af Amer: >60 mL/min (ref >60)
GFR calc non Af Amer: 59 mL/min — ABNORMAL LOW (ref >60)
Glucose, Bld: 92 mg/dL (ref 70–99)
Potassium: 4.6 mmol/L (ref 3.5–5.1)
Sodium: 137 mmol/L (ref 135–145)

## 2019-06-11 LAB — TSH: TSH: 0.975 u[IU]/mL (ref 0.350–4.500)

## 2019-06-11 LAB — CK: Total CK: 91 U/L (ref 49–397)

## 2019-06-11 MED ORDER — SODIUM CHLORIDE 0.9 % IV SOLN: INTRAVENOUS | Status: DC

## 2019-06-11 MED ORDER — ONDANSETRON HCL 4 MG/2ML IJ SOLN: 4.0000 mg | Freq: Four times a day (QID) | INTRAMUSCULAR | Status: DC | PRN

## 2019-06-11 MED ORDER — OXYCODONE-ACETAMINOPHEN 5-325 MG PO TABS
1.0000 | ORAL_TABLET | ORAL | Status: DC | PRN
  Filled 2019-06-11: qty 1

## 2019-06-11 MED ORDER — HYDROMORPHONE HCL 1 MG/ML IJ SOLN: 0.3000 mg | INTRAMUSCULAR | Status: DC | PRN

## 2019-06-11 MED ORDER — SODIUM CHLORIDE 0.9% FLUSH
3.0000 mL | Freq: Two times a day (BID) | INTRAVENOUS | Status: DC
  Administered 2019-06-11: 21:00:00 3 mL via INTRAVENOUS

## 2019-06-11 MED ORDER — ONDANSETRON HCL 4 MG PO TABS: 4.0000 mg | ORAL_TABLET | Freq: Four times a day (QID) | ORAL | Status: DC | PRN

## 2019-06-11 MED ORDER — POLYETHYLENE GLYCOL 3350 17 G PO PACK: 17.0000 g | PACK | Freq: Every day | ORAL | Status: DC | PRN

## 2019-06-11 MED ORDER — LEVOTHYROXINE SODIUM 88 MCG PO TABS
88.0000 ug | ORAL_TABLET | Freq: Every day | ORAL | Status: DC
  Administered 2019-06-13: 88 ug via ORAL
  Filled 2019-06-11 (×2): qty 1

## 2019-06-11 NOTE — ED Provider Notes (Signed)
I saw and evaluated the patient, reviewed the resident's note and I agree with the findings and plan.  Pertinent History: This patient is an elderly 86 year old male who presents after mechanical fall tripping over something in his workshop while he was trying to fix a mailbox.  Landed directly on his left shoulder, laid on the ground for about 1 hour before help came.  He had pain directly in the shoulder with associated swelling but denies numbness of the arm.  No head injury, no leg pain hip pain or back pain.  Pertinent Exam findings: The patient is not anticoagulated.  On exam he does have a swollen left shoulder extending to the proximal left upper extremity, there is no pain with range of motion of the elbow at the elbow just at the shoulder.  He has normal pulses at the left wrist, normal sensation, hips examined bilaterally without pain with range of motion and no leg length discrepancies.  No signs of head injury, normal heart and lung exam  I was personally present and directly supervised the following procedures:  Evaluation for minor trauma, immobilization Pt has fracture of the proximal humerus - this has complicating factors and patient likely needs orthopedic consultation   I personally interpreted the EKG as well as the resident and agree with the interpretation on the resident's chart.  Final diagnoses:  Fall  Surgery, elective  Postop check      Noemi Chapel, MD 06/13/19 [phone]

## 2019-06-11 NOTE — ED Provider Notes (Signed)
[facility] EMERGENCY DEPARTMENT Provider Note   CSN: [id number] Arrival date & time: 06/11/19  1458     History Chief Complaint  Patient presents with  . Fall  . Left Shoulder pain    Derrick Odonnell is a 86 y.o. male.  HPI   86 year old male with past medical history of HLD, hypothyroidism on Synthroid, prostate cancer presented to the emergency department brought in by EMS following a nonsyncopal fall complaining of L shoulder pain. The patient was working in his shop when he tripped over some tools falling onto his L side. He did not hit his head or lose consciousness. He is not on anticoagulation. He is complaining of pain to his L shoulder and L upper extremity with associated swelling and decreased ROM. He arrived in a triangular sling.   Past Medical History:  Diagnosis Date  . Hyperlipidemia   . Hypothyroidism   . Prostate cancer     Patient Active Problem List   Diagnosis Date Noted  . ARF (acute renal failure) ([place]) 11/27/2013  . Acute pulmonary edema ([place]) 11/27/2013  . Hyperkalemia 11/27/2013  . Hydronephrosis 11/27/2013  . Atrial fibrillation with RVR ([place]) 11/27/2013    No past surgical history on file.     Family History  Problem Relation Age of Onset  . Diabetes type II Father   . Bladder Cancer Brother     Social History   Tobacco Use  . Smoking status: Former Research scientist (life sciences)  . Smokeless tobacco: Current User    Types: Chew  Substance Use Topics  . Alcohol use: No  . Drug use: No    Home Medications Prior to Admission medications   Medication Sig Start Date End Date Taking? Authorizing Provider  ALFALFA PO Take 1 capsule by mouth daily.    [provider]  B Complex Vitamins (B COMPLEX PO) Take 1 tablet by mouth daily.    [provider]  Calcium Carb-Cholecalciferol (CALCIUM 600 + D PO) Take 1 tablet by mouth daily.    [provider]  Coenzyme Q10 (CO Q 10 PO) Take 1 capsule by mouth daily.     [provider]  diltiazem (CARDIZEM CD) 240 MG 24 hr capsule Take 1 capsule (240 mg total) by mouth daily. 11/30/13   Geradine Girt, DO  levofloxacin (LEVAQUIN) 750 MG tablet Take 1 tablet (750 mg total) by mouth every other day. 11/30/13   Geradine Girt, DO  levothyroxine (SYNTHROID, LEVOTHROID) 88 MCG tablet Take 88 mcg by mouth daily before breakfast.    [provider]  OVER THE COUNTER MEDICATION Take 1 capsule by mouth daily. OMEGA RED    [provider]  predniSONE (DELTASONE) 20 MG tablet Take 2 tablets (40 mg total) by mouth daily with breakfast. 11/30/13   Geradine Girt, DO  tamsulosin (FLOMAX) 0.4 MG CAPS capsule Take 1 capsule (0.4 mg total) by mouth daily after supper. 11/30/13   Geradine Girt, DO    Allergies    Patient has no known allergies.  Review of Systems   Review of Systems  Constitutional: Negative for activity change, appetite change, chills, diaphoresis, fatigue and fever.  HENT: Negative for congestion and rhinorrhea.   Respiratory: Negative for cough, shortness of breath and wheezing.   Cardiovascular: Negative for chest pain.  Gastrointestinal: Negative for abdominal distention, abdominal pain, diarrhea, nausea and vomiting.  Genitourinary: Negative for decreased urine volume, difficulty urinating, dysuria, frequency and urgency.  Musculoskeletal: Positive for arthralgias and  joint swelling. Negative for gait problem.  Skin: Negative for color change and wound.  Neurological: Negative for dizziness, syncope, weakness, light-headedness and headaches.  All other systems reviewed and are negative.   Physical Exam Updated Vital Signs Ht 5\' 6"  (1.676 m)   Wt 62.6 kg   BMI 22.27 kg/m   Physical Exam Vitals and nursing note reviewed.  Constitutional:      General: He is not in acute distress.    Appearance: Normal appearance. He is normal weight. He is not ill-appearing.  HENT:     Head: Normocephalic and atraumatic.      Right Ear: External ear normal.     Left Ear: External ear normal.     Nose: Nose normal.     Mouth/Throat:     Mouth: Mucous membranes are moist.     Pharynx: Oropharynx is clear.  Eyes:     Extraocular Movements: Extraocular movements intact.  Cardiovascular:     Rate and Rhythm: Normal rate and regular rhythm.     Pulses: Normal pulses.     Heart sounds: Normal heart sounds.  Pulmonary:     Effort: Pulmonary effort is normal. No respiratory distress.     Breath sounds: Normal breath sounds. No wheezing or rhonchi.  Abdominal:     General: Bowel sounds are normal.     Palpations: Abdomen is soft.     Tenderness: There is no abdominal tenderness. There is no guarding.  Musculoskeletal:     Right shoulder: Normal.     Left shoulder: Swelling, deformity, tenderness and bony tenderness present. Decreased range of motion. Normal strength. Normal pulse.     Right upper arm: Normal.     Left upper arm: Normal.     Right elbow: Normal. No swelling.     Left elbow: Normal.     Cervical back: Normal range of motion.     Right lower leg: No edema.     Left lower leg: No edema.     Comments: Neurovascular intact in the distal left upper extremity in all major nerve distributions, normal function of the hand, wrist and elbow  Skin:    General: Skin is warm and dry.     Capillary Refill: Capillary refill takes less than 2 seconds.  Neurological:     General: No focal deficit present.     Mental Status: He is alert and oriented to person, place, and time. Mental status is at baseline.  Psychiatric:        Mood and Affect: Mood normal.     ED Results / Procedures / Treatments   Labs (all labs ordered are listed, but only abnormal results are displayed) Labs Reviewed  CBC - Abnormal; Notable for the following components:      Result Value   RBC 4.20 (*)    Hemoglobin 12.6 (*)    HCT 38.9 (*)    All other components within normal limits  BASIC METABOLIC PANEL     EKG None  Radiology DG Chest 1 View  Result Date: 06/11/2019 CLINICAL DATA:  Left shoulder and left elbow pain after fall, previous tobacco abuse EXAM: CHEST  1 VIEW COMPARISON:  12/01/2013 FINDINGS: Single frontal view of the chest demonstrates a stable cardiac silhouette. Mild ectasia of the thoracic aorta. There is chronic scarring throughout the lungs consistent with history of tobacco abuse. No airspace disease, effusion, or pneumothorax. Impacted left humeral neck fracture is visualized. Please refer to dedicated left shoulder evaluation IMPRESSION: 1. Left  humeral neck fracture. 2. Chronic interstitial scarring consistent with tobacco abuse. No acute intrathoracic process. Electronically Signed   By: Randa Ngo M.D.   On: 06/11/2019 16:05   DG Elbow Complete Left  Result Date: 06/11/2019 CLINICAL DATA:  Left elbow pain after fall EXAM: LEFT ELBOW - COMPLETE 3+ VIEW COMPARISON:  None. FINDINGS: Frontal, bilateral oblique, and lateral views of the left elbow are obtained. No acute displaced fracture, subluxation, or dislocation. No joint effusion. Soft tissues are unremarkable. IMPRESSION: 1. Unremarkable left elbow. Electronically Signed   By: Randa Ngo M.D.   On: 06/11/2019 16:07   DG Shoulder Left  Result Date: 06/11/2019 CLINICAL DATA:  Left shoulder pain after fall EXAM: LEFT SHOULDER - 2+ VIEW COMPARISON:  None. FINDINGS: Internal rotation, external rotation, and transscapular views of the left shoulder demonstrate and impacted and displaced left humeral neck fracture which is mildly comminuted. There is no dislocation. Mild glenohumeral joint space narrowing. Mild hypertrophic change of the acromioclavicular joint. Left chest is clear. IMPRESSION: 1. Mildly comminuted and impacted left humeral neck fracture. No dislocation. Electronically Signed   By: Randa Ngo M.D.   On: 06/11/2019 16:06   DG Humerus Left  Result Date: 06/11/2019 CLINICAL DATA:  Left shoulder and  elbow pain after fall today EXAM: LEFT HUMERUS - 2+ VIEW COMPARISON:  None. FINDINGS: Frontal and lateral views of the left humerus demonstrate a mildly comminuted and impacted left humeral neck fracture. Left shoulder is well aligned. The remainder of the left humerus is unremarkable. Mild osteoarthritis of the left shoulder. Left chest is clear. IMPRESSION: 1. Mildly comminuted and impacted left humeral neck fracture. Electronically Signed   By: Randa Ngo M.D.   On: 06/11/2019 16:07    Procedures Procedures (including critical care time)  Medications Ordered in ED Medications - No data to display  ED Course  I have reviewed the triage vital signs and the nursing notes.  Pertinent labs & imaging results that were available during my care of the patient were reviewed by me and considered in my medical decision making (see chart for details).    MDM Rules/Calculators/A&P                      58-year-old male with past medical history of HLD, hypothyroidism on Synthroid, prostate cancer presented to the emergency department brought in by EMS following a nonsyncopal fall complaining of L shoulder pain.  Initial concerns most for osseous injuries to the left upper extremity second to patient's likely mechanical fall.  Low suspicion for syncopal event or seizure-like event.  Patient did not have any head trauma and does not display any evidence of such.  No neck pain either feel the patient does not warrant CT head or neck imaging.  And is also not anticoagulated.  Labs demonstrated no significant leukocytosis, hemoglobin 12.6 with MCV 92.6, platelets 225, BMP pending  Radiographs of the left upper extremity demonstrated a comminuted and impacted fracture of the left humeral neck without dislocation  Consult placed to orthopedic surgery for consideration of operative management and further recommendations in regard to the patient's humeral neck fracture.  Spoke to Dr. Griffin Basil who recommended  a CT of the L shoulder for consideration of urgent/emergent intervention.  Following CT, Dr. Griffin Basil recommended operative management given the patient has an unstable displaced fracture.  Additionally recommended admission to the hospitalist for optimization in the setting of patient's geriatric age and comorbidities.  We will admit to hospitalist for  ongoing evaluation, monitoring, management and coordination of care  The plan for this patient was discussed with Dr. Sabra Heck, who voiced agreement and who oversaw evaluation and treatment of this patient.  Final Clinical Impression(s) / ED Diagnoses Final diagnoses:  Fall    Rx / DC Orders ED Discharge Orders    None       Filbert Berthold, MD 06/11/19 Erskin Burnet    Noemi Chapel, MD 06/13/19 [phone]

## 2019-06-11 NOTE — H&P (View-Only) (Signed)
ORTHOPAEDIC CONSULTATION  REQUESTING PHYSICIAN: Sid Falcon, MD  Chief Complaint: fall  HPI: Derrick Odonnell is a 86 y.o. male with past medical history significant for hypothyroidism and paroxysmal a. Fib who had a fall earlier today while working in his shop trying to fix a mailbox for his daughter. He tripped and fell onto his left arm. He could not get off the floor after this happened. He was taken to [facility] Emergency Department by EMS.   Upon examination, patient is resting in hospital bed. His daughter is at the bedside. He notes pain only in his left shoulder. He denies any pain to any other extremity. Pain worse with any movement. He denies any loss of consciousness.   Per patient and his daughter, he ambulates on his own. He is very active and likes working in his shop and enjoys walking in the woods. His birthday is tomorrow.   Past Medical History:  Diagnosis Date  . Hyperlipidemia   . Hypothyroidism   . Prostate cancer    No past surgical history on file. Social History   Socioeconomic History  . Marital status: Married    Spouse name: Not on file  . Number of children: Not on file  . Years of education: Not on file  . Highest education level: Not on file  Occupational History  . Not on file  Tobacco Use  . Smoking status: Former Research scientist (life sciences)  . Smokeless tobacco: Current User    Types: Chew  Substance and Sexual Activity  . Alcohol use: No  . Drug use: No  . Sexual activity: Not on file  Other Topics Concern  . Not on file  Social History Narrative  . Not on file   Social Determinants of Health   Financial Resource Strain:   . Difficulty of Paying Living Expenses:   Food Insecurity:   . Worried About Charity fundraiser in the Last Year:   . Arboriculturist in the Last Year:   Transportation Needs:   . Film/video editor (Medical):   Marland Kitchen Lack of Transportation (Non-Medical):   Physical Activity:   . Days of Exercise per Week:   . Minutes  of Exercise per Session:   Stress:   . Feeling of Stress :   Social Connections:   . Frequency of Communication with Friends and Family:   . Frequency of Social Gatherings with Friends and Family:   . Attends Religious Services:   . Active Member of Clubs or Organizations:   . Attends Archivist Meetings:   Marland Kitchen Marital Status:    Family History  Problem Relation Age of Onset  . Diabetes type II Father   . Bladder Cancer Brother    No Known Allergies Prior to Admission medications   Medication Sig Start Date End Date Taking? Authorizing Provider  ALFALFA PO Take 1 capsule by mouth daily.    [provider]  B Complex Vitamins (B COMPLEX PO) Take 1 tablet by mouth daily.    [provider]  Calcium Carb-Cholecalciferol (CALCIUM 600 + D PO) Take 1 tablet by mouth daily.    [provider]  Coenzyme Q10 (CO Q 10 PO) Take 1 capsule by mouth daily.    [provider]  diltiazem (CARDIZEM CD) 240 MG 24 hr capsule Take 1 capsule (240 mg total) by mouth daily. 11/30/13   Geradine Girt, DO  levofloxacin (LEVAQUIN) 750 MG tablet Take 1 tablet (750 mg  total) by mouth every other day. 11/30/13   Geradine Girt, DO  levothyroxine (SYNTHROID, LEVOTHROID) 88 MCG tablet Take 88 mcg by mouth daily before breakfast.    [provider]  OVER THE COUNTER MEDICATION Take 1 capsule by mouth daily. OMEGA RED    [provider]  predniSONE (DELTASONE) 20 MG tablet Take 2 tablets (40 mg total) by mouth daily with breakfast. 11/30/13   Geradine Girt, DO  tamsulosin (FLOMAX) 0.4 MG CAPS capsule Take 1 capsule (0.4 mg total) by mouth daily after supper. 11/30/13   Geradine Girt, DO   DG Chest 1 View  Result Date: 06/11/2019 CLINICAL DATA:  Left shoulder and left elbow pain after fall, previous tobacco abuse EXAM: CHEST  1 VIEW COMPARISON:  12/01/2013 FINDINGS: Single frontal view of the chest demonstrates a stable cardiac silhouette. Mild  ectasia of the thoracic aorta. There is chronic scarring throughout the lungs consistent with history of tobacco abuse. No airspace disease, effusion, or pneumothorax. Impacted left humeral neck fracture is visualized. Please refer to dedicated left shoulder evaluation IMPRESSION: 1. Left humeral neck fracture. 2. Chronic interstitial scarring consistent with tobacco abuse. No acute intrathoracic process. Electronically Signed   By: Randa Ngo M.D.   On: 06/11/2019 16:05   DG Elbow Complete Left  Result Date: 06/11/2019 CLINICAL DATA:  Left elbow pain after fall EXAM: LEFT ELBOW - COMPLETE 3+ VIEW COMPARISON:  None. FINDINGS: Frontal, bilateral oblique, and lateral views of the left elbow are obtained. No acute displaced fracture, subluxation, or dislocation. No joint effusion. Soft tissues are unremarkable. IMPRESSION: 1. Unremarkable left elbow. Electronically Signed   By: Randa Ngo M.D.   On: 06/11/2019 16:07   CT Shoulder Left Wo Contrast  Result Date: 06/11/2019 CLINICAL DATA:  Left shoulder pain, fracture EXAM: CT OF THE UPPER LEFT EXTREMITY WITHOUT CONTRAST TECHNIQUE: Multidetector CT imaging of the upper left extremity was performed according to the standard protocol. COMPARISON:  06/11/2019 FINDINGS: Bones/Joint/Cartilage There is an impacted displaced fracture of the surgical neck of the proximal left humerus. There is approximately 12 mm of lateral displacement of the distal fracture fragment. There is approximately 17 mm of separation along the ventral margin of the fracture line, with resulting approximately 30 degrees of ventral angulation at the fracture site. The appearance is compatible with a Neer 2 part fracture. Os acromiale is incidentally noted. I do not see any scapular or clavicular fractures. There is anatomic alignment of the glenohumeral and acromioclavicular joints. Visualized portions of the left thoracic cage are unremarkable. Ligaments Suboptimally assessed by CT.  Muscles and Tendons No evidence of muscular injury. Soft tissues Mild soft tissue edema surrounding the fracture site. No fluid collections. Visualized portions of the mediastinum are unremarkable. IMPRESSION: Impacted displaced fracture of the surgical neck of the proximal left humerus, compatible with a Neer 2 part fracture. Electronically Signed   By: Randa Ngo M.D.   On: 06/11/2019 17:58   DG Shoulder Left  Result Date: 06/11/2019 CLINICAL DATA:  Left shoulder pain after fall EXAM: LEFT SHOULDER - 2+ VIEW COMPARISON:  None. FINDINGS: Internal rotation, external rotation, and transscapular views of the left shoulder demonstrate and impacted and displaced left humeral neck fracture which is mildly comminuted. There is no dislocation. Mild glenohumeral joint space narrowing. Mild hypertrophic change of the acromioclavicular joint. Left chest is clear. IMPRESSION: 1. Mildly comminuted and impacted left humeral neck fracture. No dislocation. Electronically Signed   By: Diana Eves.D.  On: 06/11/2019 16:06   DG Humerus Left  Result Date: 06/11/2019 CLINICAL DATA:  Left shoulder and elbow pain after fall today EXAM: LEFT HUMERUS - 2+ VIEW COMPARISON:  None. FINDINGS: Frontal and lateral views of the left humerus demonstrate a mildly comminuted and impacted left humeral neck fracture. Left shoulder is well aligned. The remainder of the left humerus is unremarkable. Mild osteoarthritis of the left shoulder. Left chest is clear. IMPRESSION: 1. Mildly comminuted and impacted left humeral neck fracture. Electronically Signed   By: Randa Ngo M.D.   On: 06/11/2019 16:07   Family History Reviewed and non-contributory, no pertinent history of problems with bleeding or anesthesia      Review of Systems 14 system ROS conducted and negative except for that noted in HPI   OBJECTIVE  Vitals: Patient Vitals for the past 8 hrs:  Height Weight  06/11/19 1520 _0  (1.676 m) 62.6 kg   General:  Alert, no acute distress Cardiovascular: Warm extremities noted Respiratory: No cyanosis, no use of accessory musculature GI: No organomegaly, abdomen is soft and non-tender Skin: No lesions in the area of chief complaint other than those listed below in MSK exam.  Neurologic: Sensation intact distally save for the below mentioned MSK exam Psychiatric: Patient is competent for consent with normal mood and affect Lymphatic: No swelling obvious and reported other than the area involved in the exam below  Extremities  RUE: active ROM of shoulder, elbow, wrist without pain. neurovascularly intact LUE: ROM of shoulder not tested in setting of known fracture. tender to palpation about left shoulder. Non-tender to palpation elbow, wrist, hand. He endorses axillary sensation. + Motor in  AIN, PIN, Ulnar distributions. Sensation intact in medial, radial, and ulnar distributions. Well perfused digits.  RLE: nontender to palpation about hip, knee, calf, ankle. active ROM of RLE without pain. neurovascularly intact LLE: nontender to palpation about hip, knee, calf, ankle. active ROM of LLE without pain. neurovascularly intact    Test Results Imaging CT of left shoulder reviewed and demonstrates an impacted displaced fracture of the surgical neck of the proximal left humerus, compatible with a Neer 2 part fracture.  Labs cbc Recent Labs    06/11/19 1520  WBC 9.6  HGB 12.6*  HCT 38.9*  PLT 225    Labs inflam No results for input(s): CRP in the last 72 hours.  Invalid input(s): ESR  Labs coag No results for input(s): INR, PTT in the last 72 hours.  Invalid input(s): PT  Recent Labs    06/11/19 1520  NA 137  K 4.6  CL 100  CO2 23  GLUCOSE 92  BUN 17  CREATININE 1.09  CALCIUM 8.9     ASSESSMENT AND PLAN: 86 y.o. male with the following: displaced left proximal humerus fracture  This patient requires inpatient admission to manage this problem appropriately due to his geriatric  age and co-morbidities.  The risks benefits and alternatives were discussed with the patient including but not limited to the risks of nonoperative treatment, versus surgical intervention including infection, bleeding, nerve injury, periprosthetic fracture, the need for revision surgery, blood clots, cardiopulmonary complications, morbidity, mortality, among others. The patient and his daughter were willing to proceed.    - Plan: IMN left proximal humerus fracture tomorrow with Dr. Griffin Basil - Diet: NPO midnight - Weight Bearing Status/Activity: NWB LUE, Sling - Additional recommended labs/tests: None - VTE Prophylaxis: per medicine - Pain control: PRN pain medications, preferring oral medications - Contact information: Dr. Ophelia Charter,  [facility] PA-C   [place], [place] 06/11/2019

## 2019-06-11 NOTE — ED Triage Notes (Signed)
Pt tripped while he was in his shop working. He landed on his Left shoulder an laid there for an hour (per pt) before being found by his wife & EMS was called. Pt arrived to ED with his Lt arm in a sling & his Lt shoulder is swollen & reported to be sore. A/Ox4, verbal- able to make needs known.

## 2019-06-11 NOTE — H&P (Addendum)
Date: 06/11/2019               Patient Name:  Derrick Odonnell MRN: [id number]  DOB: [birth year] Age / Sex: 86 y.o., male   PCP: Lowella Dandy, NP         Medical Service: Internal Medicine Teaching Service         Attending Physician: Dr. Noemi Chapel, MD    First Contact: Dr. Tamsen Snider Pager: [phone]  Second Contact: Dr. Gilberto Better Pager: [phone]       After Hours (After 5p/  First Contact Pager: [phone]  weekends / holidays): Second Contact Pager: [phone]   Chief Complaint: Fall  History of Present Illness:  Derrick Odonnell is a 86 yo M w/ PMH of hypothyroidism, paroxysmal A.fib,  who presents to [facility] after a fall. He was examined in the ED with daughter present. He states he was working in his shop and tripped on an unknown item on the floor and next thing he knew, he was on the floor and his shoulder began to swell. He couldn't get up off the floor on his own and laid there for about an hour, hour and a half until his wife found him and called EMS. He denies any loss of consciousness, urinary/bowel incontinence. He denies trauma to any other region as he fell. He mentions occasional dizziness when standing up too fast.   He states that at baseline, he is able to ambulate and climb up a flight of stairs without difficulty. He is very active and enjoys walking in the woods. He has no known cardiac or pulmonary disease except for an episode of atrial fibrillation which has since then resolved.  In the ED, he was found to have displaced fracture of L humeral neck. Ortho was consulted who recommended OR tomorrow for ORIF. IMTS consulted for admission for management of geriatric age and comorbidities.  Meds:  Levothyroxine 88 mcg daily  Allergies: Allergies as of 06/11/2019  . (No Known Allergies)   Past Medical History:  Diagnosis Date  . Hyperlipidemia   . Hypothyroidism   . Prostate cancer    Past Surgical History  TURP  Family History:  Sister had diabetes  Social  History: From [place]. Former Company secretary. Lives with wife. Denies any alcohol. Does use chewing tobacco. No illicit substance use.   Review of Systems: A complete ROS was negative except as per HPI.  Physical Exam: Height 5\' 6"  (1.676 m), weight 62.6 kg.  Gen: Well-developed, well nourished, NAD HEENT: NCAT head, hearing intact, EOMI, MMM CV: RRR, S1, S2 normal, No rubs, holosystolic murmur Pulm: CTAB, No rales, no wheezes Abd: Soft, BS+, NTND, No rebound, no guarding  Extm: ROM intact, Peripheral pulses intact, Left upper extremity distal sensation, strength intact. L shoulder w/ tenderness to motion and palpation without erythema, edema or warmth Skin: Dry, Warm, normal turgor, no rashes, lesions, wounds.  Neuro: AAOx3  EKG: N/A  CXR: personally reviewed my interpretation is displaced fracture of humeral neck, no lobar consolidation, no pleural effusion, no pulmonary edema  Assessment & Plan by Problem: Active Problems:   * No active hospital problems. *  Derrick Odonnell is a 86 yo M w/ PMH of hypothyroidism, paroxysmal A.fib admit for left humeral neck fracture  Unstable, displaced fracture of L humeral rec Occurred after falling on tools in his shed. No loc, no trauma to head. Per ortho, planned for ORIF tomorrow. Able to perform >4 MEQS at home. Despite his age, 1 points  on Revised Cardiac Risk Index consistent with Class 2 risk for surgery (6% 30 day risk of MI, death or cardiac arrest).  - Appreciate orthopedics recs: OR tomorrow - Pain control w/ percocet + dilaudid for breakthrough - NPO at midnight - CK - Fluid resuscitation  Hypothyroidism On 59mcg at home. - Check TSH - C/w levothyroxine 50mcg if TSH wnl  Hx of A.fib? Mentions hx of irregular heart rate requiring admission in the past. On exam, regular rhythm. Family mentions it has resolved. Previously on Cardizem 240mg  - EKG - Telemetry  Dispo: Admit patient to Inpatient with expected length of stay greater than 2  midnights.  Signed: Mosetta Anis, MD 06/11/2019, 6:05 PM  Pager: [phone]

## 2019-06-11 NOTE — Consult Note (Signed)
ORTHOPAEDIC CONSULTATION  REQUESTING PHYSICIAN: Sid Falcon, MD  Chief Complaint: fall  HPI: Derrick Odonnell is a 86 y.o. male with past medical history significant for hypothyroidism and paroxysmal a. Fib who had a fall earlier today while working in his shop trying to fix a mailbox for his daughter. He tripped and fell onto his left arm. He could not get off the floor after this happened. He was taken to [facility] Emergency Department by EMS.   Upon examination, patient is resting in hospital bed. His daughter is at the bedside. He notes pain only in his left shoulder. He denies any pain to any other extremity. Pain worse with any movement. He denies any loss of consciousness.   Per patient and his daughter, he ambulates on his own. He is very active and likes working in his shop and enjoys walking in the woods. His birthday is tomorrow.   Past Medical History:  Diagnosis Date  . Hyperlipidemia   . Hypothyroidism   . Prostate cancer    No past surgical history on file. Social History   Socioeconomic History  . Marital status: Married    Spouse name: Not on file  . Number of children: Not on file  . Years of education: Not on file  . Highest education level: Not on file  Occupational History  . Not on file  Tobacco Use  . Smoking status: Former Research scientist (life sciences)  . Smokeless tobacco: Current User    Types: Chew  Substance and Sexual Activity  . Alcohol use: No  . Drug use: No  . Sexual activity: Not on file  Other Topics Concern  . Not on file  Social History Narrative  . Not on file   Social Determinants of Health   Financial Resource Strain:   . Difficulty of Paying Living Expenses:   Food Insecurity:   . Worried About Charity fundraiser in the Last Year:   . Arboriculturist in the Last Year:   Transportation Needs:   . Film/video editor (Medical):   Marland Kitchen Lack of Transportation (Non-Medical):   Physical Activity:   . Days of Exercise per Week:   . Minutes  of Exercise per Session:   Stress:   . Feeling of Stress :   Social Connections:   . Frequency of Communication with Friends and Family:   . Frequency of Social Gatherings with Friends and Family:   . Attends Religious Services:   . Active Member of Clubs or Organizations:   . Attends Archivist Meetings:   Marland Kitchen Marital Status:    Family History  Problem Relation Age of Onset  . Diabetes type II Father   . Bladder Cancer Brother    No Known Allergies Prior to Admission medications   Medication Sig Start Date End Date Taking? Authorizing Provider  ALFALFA PO Take 1 capsule by mouth daily.    [provider]  B Complex Vitamins (B COMPLEX PO) Take 1 tablet by mouth daily.    [provider]  Calcium Carb-Cholecalciferol (CALCIUM 600 + D PO) Take 1 tablet by mouth daily.    [provider]  Coenzyme Q10 (CO Q 10 PO) Take 1 capsule by mouth daily.    [provider]  diltiazem (CARDIZEM CD) 240 MG 24 hr capsule Take 1 capsule (240 mg total) by mouth daily. 11/30/13   Geradine Girt, DO  levofloxacin (LEVAQUIN) 750 MG tablet Take 1 tablet (750 mg  total) by mouth every other day. 11/30/13   Geradine Girt, DO  levothyroxine (SYNTHROID, LEVOTHROID) 88 MCG tablet Take 88 mcg by mouth daily before breakfast.    [provider]  OVER THE COUNTER MEDICATION Take 1 capsule by mouth daily. OMEGA RED    [provider]  predniSONE (DELTASONE) 20 MG tablet Take 2 tablets (40 mg total) by mouth daily with breakfast. 11/30/13   Geradine Girt, DO  tamsulosin (FLOMAX) 0.4 MG CAPS capsule Take 1 capsule (0.4 mg total) by mouth daily after supper. 11/30/13   Geradine Girt, DO   DG Chest 1 View  Result Date: 06/11/2019 CLINICAL DATA:  Left shoulder and left elbow pain after fall, previous tobacco abuse EXAM: CHEST  1 VIEW COMPARISON:  12/01/2013 FINDINGS: Single frontal view of the chest demonstrates a stable cardiac silhouette. Mild  ectasia of the thoracic aorta. There is chronic scarring throughout the lungs consistent with history of tobacco abuse. No airspace disease, effusion, or pneumothorax. Impacted left humeral neck fracture is visualized. Please refer to dedicated left shoulder evaluation IMPRESSION: 1. Left humeral neck fracture. 2. Chronic interstitial scarring consistent with tobacco abuse. No acute intrathoracic process. Electronically Signed   By: Randa Ngo M.D.   On: 06/11/2019 16:05   DG Elbow Complete Left  Result Date: 06/11/2019 CLINICAL DATA:  Left elbow pain after fall EXAM: LEFT ELBOW - COMPLETE 3+ VIEW COMPARISON:  None. FINDINGS: Frontal, bilateral oblique, and lateral views of the left elbow are obtained. No acute displaced fracture, subluxation, or dislocation. No joint effusion. Soft tissues are unremarkable. IMPRESSION: 1. Unremarkable left elbow. Electronically Signed   By: Randa Ngo M.D.   On: 06/11/2019 16:07   CT Shoulder Left Wo Contrast  Result Date: 06/11/2019 CLINICAL DATA:  Left shoulder pain, fracture EXAM: CT OF THE UPPER LEFT EXTREMITY WITHOUT CONTRAST TECHNIQUE: Multidetector CT imaging of the upper left extremity was performed according to the standard protocol. COMPARISON:  06/11/2019 FINDINGS: Bones/Joint/Cartilage There is an impacted displaced fracture of the surgical neck of the proximal left humerus. There is approximately 12 mm of lateral displacement of the distal fracture fragment. There is approximately 17 mm of separation along the ventral margin of the fracture line, with resulting approximately 30 degrees of ventral angulation at the fracture site. The appearance is compatible with a Neer 2 part fracture. Os acromiale is incidentally noted. I do not see any scapular or clavicular fractures. There is anatomic alignment of the glenohumeral and acromioclavicular joints. Visualized portions of the left thoracic cage are unremarkable. Ligaments Suboptimally assessed by CT.  Muscles and Tendons No evidence of muscular injury. Soft tissues Mild soft tissue edema surrounding the fracture site. No fluid collections. Visualized portions of the mediastinum are unremarkable. IMPRESSION: Impacted displaced fracture of the surgical neck of the proximal left humerus, compatible with a Neer 2 part fracture. Electronically Signed   By: Randa Ngo M.D.   On: 06/11/2019 17:58   DG Shoulder Left  Result Date: 06/11/2019 CLINICAL DATA:  Left shoulder pain after fall EXAM: LEFT SHOULDER - 2+ VIEW COMPARISON:  None. FINDINGS: Internal rotation, external rotation, and transscapular views of the left shoulder demonstrate and impacted and displaced left humeral neck fracture which is mildly comminuted. There is no dislocation. Mild glenohumeral joint space narrowing. Mild hypertrophic change of the acromioclavicular joint. Left chest is clear. IMPRESSION: 1. Mildly comminuted and impacted left humeral neck fracture. No dislocation. Electronically Signed   By: Diana Eves.D.  On: 06/11/2019 16:06   DG Humerus Left  Result Date: 06/11/2019 CLINICAL DATA:  Left shoulder and elbow pain after fall today EXAM: LEFT HUMERUS - 2+ VIEW COMPARISON:  None. FINDINGS: Frontal and lateral views of the left humerus demonstrate a mildly comminuted and impacted left humeral neck fracture. Left shoulder is well aligned. The remainder of the left humerus is unremarkable. Mild osteoarthritis of the left shoulder. Left chest is clear. IMPRESSION: 1. Mildly comminuted and impacted left humeral neck fracture. Electronically Signed   By: Randa Ngo M.D.   On: 06/11/2019 16:07   Family History Reviewed and non-contributory, no pertinent history of problems with bleeding or anesthesia      Review of Systems 14 system ROS conducted and negative except for that noted in HPI   OBJECTIVE  Vitals: Patient Vitals for the past 8 hrs:  Height Weight  06/11/19 1520 _0  (1.676 m) 62.6 kg   General:  Alert, no acute distress Cardiovascular: Warm extremities noted Respiratory: No cyanosis, no use of accessory musculature GI: No organomegaly, abdomen is soft and non-tender Skin: No lesions in the area of chief complaint other than those listed below in MSK exam.  Neurologic: Sensation intact distally save for the below mentioned MSK exam Psychiatric: Patient is competent for consent with normal mood and affect Lymphatic: No swelling obvious and reported other than the area involved in the exam below  Extremities  RUE: active ROM of shoulder, elbow, wrist without pain. neurovascularly intact LUE: ROM of shoulder not tested in setting of known fracture. tender to palpation about left shoulder. Non-tender to palpation elbow, wrist, hand. He endorses axillary sensation. + Motor in  AIN, PIN, Ulnar distributions. Sensation intact in medial, radial, and ulnar distributions. Well perfused digits.  RLE: nontender to palpation about hip, knee, calf, ankle. active ROM of RLE without pain. neurovascularly intact LLE: nontender to palpation about hip, knee, calf, ankle. active ROM of LLE without pain. neurovascularly intact    Test Results Imaging CT of left shoulder reviewed and demonstrates an impacted displaced fracture of the surgical neck of the proximal left humerus, compatible with a Neer 2 part fracture.  Labs cbc Recent Labs    06/11/19 1520  WBC 9.6  HGB 12.6*  HCT 38.9*  PLT 225    Labs inflam No results for input(s): CRP in the last 72 hours.  Invalid input(s): ESR  Labs coag No results for input(s): INR, PTT in the last 72 hours.  Invalid input(s): PT  Recent Labs    06/11/19 1520  NA 137  K 4.6  CL 100  CO2 23  GLUCOSE 92  BUN 17  CREATININE 1.09  CALCIUM 8.9     ASSESSMENT AND PLAN: 86 y.o. male with the following: displaced left proximal humerus fracture  This patient requires inpatient admission to manage this problem appropriately due to his geriatric  age and co-morbidities.  The risks benefits and alternatives were discussed with the patient including but not limited to the risks of nonoperative treatment, versus surgical intervention including infection, bleeding, nerve injury, periprosthetic fracture, the need for revision surgery, blood clots, cardiopulmonary complications, morbidity, mortality, among others. The patient and his daughter were willing to proceed.    - Plan: IMN left proximal humerus fracture tomorrow with Dr. Griffin Basil - Diet: NPO midnight - Weight Bearing Status/Activity: NWB LUE, Sling - Additional recommended labs/tests: None - VTE Prophylaxis: per medicine - Pain control: PRN pain medications, preferring oral medications - Contact information: Dr. Ophelia Charter,  [facility] PA-C   [place], [place] 06/11/2019

## 2019-06-12 ENCOUNTER — Inpatient Hospital Stay (HOSPITAL_COMMUNITY): Payer: Medicare HMO | Admitting: Certified Registered"

## 2019-06-12 ENCOUNTER — Encounter (HOSPITAL_COMMUNITY)
Admission: EM | Disposition: A | Discharge status: Home / Self Care | Payer: Self-pay | Source: Home / Self Care | Attending: Internal Medicine

## 2019-06-12 ENCOUNTER — Other Ambulatory Visit: Payer: Self-pay

## 2019-06-12 ENCOUNTER — Encounter (HOSPITAL_COMMUNITY): Payer: Self-pay | Admitting: Internal Medicine

## 2019-06-12 ENCOUNTER — Inpatient Hospital Stay (HOSPITAL_COMMUNITY): Payer: Medicare HMO

## 2019-06-12 DIAGNOSIS — S42302A Unspecified fracture of shaft of humerus, left arm, initial encounter for closed fracture: Secondary | ICD-10-CM

## 2019-06-12 DIAGNOSIS — W19XXXA Unspecified fall, initial encounter: Secondary | ICD-10-CM

## 2019-06-12 HISTORY — PX: HUMERUS IM NAIL: SHX1769

## 2019-06-12 LAB — SURGICAL PCR SCREEN
MRSA, PCR: NEGATIVE
Staphylococcus aureus: NEGATIVE

## 2019-06-12 LAB — SARS CORONAVIRUS 2 (TAT 6-24 HRS): SARS Coronavirus 2: NEGATIVE

## 2019-06-12 SURGERY — INSERTION, INTRAMEDULLARY ROD, HUMERUS
Anesthesia: Monitor Anesthesia Care | Site: Arm Upper | Laterality: Left

## 2019-06-12 MED ORDER — OXYCODONE HCL 5 MG PO TABS: ORAL_TABLET | ORAL | 0 refills | Status: AC

## 2019-06-12 MED ORDER — BUPIVACAINE LIPOSOME 1.3 % IJ SUSP
INTRAMUSCULAR | Status: DC | PRN
  Administered 2019-06-12: 10 mL via PERINEURAL

## 2019-06-12 MED ORDER — BISACODYL 5 MG PO TBEC: 5.0000 mg | DELAYED_RELEASE_TABLET | Freq: Every day | ORAL | Status: DC | PRN

## 2019-06-12 MED ORDER — OXYCODONE HCL 5 MG PO TABS: 5.0000 mg | ORAL_TABLET | ORAL | Status: DC | PRN

## 2019-06-12 MED ORDER — PROPOFOL 10 MG/ML IV BOLUS
INTRAVENOUS | Status: DC | PRN
  Administered 2019-06-12: 100 mg via INTRAVENOUS

## 2019-06-12 MED ORDER — ACETAMINOPHEN 500 MG PO TABS
1000.0000 mg | ORAL_TABLET | Freq: Three times a day (TID) | ORAL | 0 refills | Status: AC
Start: 2019-06-12 — End: 2019-06-26

## 2019-06-12 MED ORDER — CEFAZOLIN SODIUM-DEXTROSE 2-4 GM/100ML-% IV SOLN
2.0000 g | INTRAVENOUS | Status: AC
  Administered 2019-06-12: 2 g via INTRAVENOUS

## 2019-06-12 MED ORDER — LIDOCAINE 2% (20 MG/ML) 5 ML SYRINGE
INTRAMUSCULAR | Status: DC | PRN
  Administered 2019-06-12: 60 mg via INTRAVENOUS

## 2019-06-12 MED ORDER — CHLORHEXIDINE GLUCONATE 4 % EX LIQD: 60.0000 mL | Freq: Once | CUTANEOUS | Status: DC

## 2019-06-12 MED ORDER — ESMOLOL HCL 100 MG/10ML IV SOLN
INTRAVENOUS | Status: DC | PRN
  Administered 2019-06-12 (×3): 20 mg via INTRAVENOUS
  Administered 2019-06-12 (×2): 10 mg via INTRAVENOUS

## 2019-06-12 MED ORDER — METOCLOPRAMIDE HCL 5 MG PO TABS: 5.0000 mg | ORAL_TABLET | Freq: Three times a day (TID) | ORAL | Status: DC | PRN

## 2019-06-12 MED ORDER — ACETAMINOPHEN 500 MG PO TABS
1000.0000 mg | ORAL_TABLET | Freq: Three times a day (TID) | ORAL | Status: DC
  Administered 2019-06-12 – 2019-06-13 (×4): 1000 mg via ORAL
  Filled 2019-06-12 (×4): qty 2

## 2019-06-12 MED ORDER — CEFAZOLIN SODIUM-DEXTROSE 1-4 GM/50ML-% IV SOLN
1.0000 g | Freq: Four times a day (QID) | INTRAVENOUS | Status: AC
  Administered 2019-06-12 – 2019-06-13 (×3): 1 g via INTRAVENOUS
  Filled 2019-06-12 (×3): qty 50

## 2019-06-12 MED ORDER — ONDANSETRON HCL 4 MG PO TABS: 4.0000 mg | ORAL_TABLET | Freq: Three times a day (TID) | ORAL | 1 refills | Status: AC | PRN

## 2019-06-12 MED ORDER — ONDANSETRON HCL 4 MG/2ML IJ SOLN
INTRAMUSCULAR | Status: DC | PRN
  Administered 2019-06-12: 4 mg via INTRAVENOUS

## 2019-06-12 MED ORDER — CEFAZOLIN SODIUM-DEXTROSE 2-4 GM/100ML-% IV SOLN
INTRAVENOUS | Status: AC
  Filled 2019-06-12: qty 100

## 2019-06-12 MED ORDER — POLYETHYLENE GLYCOL 3350 17 G PO PACK: 17.0000 g | PACK | Freq: Every day | ORAL | Status: DC | PRN

## 2019-06-12 MED ORDER — PHENYLEPHRINE 40 MCG/ML (10ML) SYRINGE FOR IV PUSH (FOR BLOOD PRESSURE SUPPORT)
PREFILLED_SYRINGE | INTRAVENOUS | Status: DC | PRN
  Administered 2019-06-12 (×2): 80 ug via INTRAVENOUS

## 2019-06-12 MED ORDER — FENTANYL CITRATE (PF) 100 MCG/2ML IJ SOLN: 50.0000 ug | Freq: Once | INTRAMUSCULAR | Status: AC

## 2019-06-12 MED ORDER — PROPOFOL 10 MG/ML IV BOLUS
INTRAVENOUS | Status: AC
  Filled 2019-06-12: qty 20

## 2019-06-12 MED ORDER — LACTATED RINGERS IV SOLN
INTRAVENOUS | Status: DC | PRN
Start: 2019-06-12 — End: 2019-06-12

## 2019-06-12 MED ORDER — 0.9 % SODIUM CHLORIDE (POUR BTL) OPTIME
TOPICAL | Status: DC | PRN
  Administered 2019-06-12: 1000 mL

## 2019-06-12 MED ORDER — ONDANSETRON HCL 4 MG PO TABS: 4.0000 mg | ORAL_TABLET | Freq: Four times a day (QID) | ORAL | Status: DC | PRN

## 2019-06-12 MED ORDER — BUPIVACAINE-EPINEPHRINE (PF) 0.5% -1:200000 IJ SOLN
INTRAMUSCULAR | Status: DC | PRN
  Administered 2019-06-12: 15 mL via PERINEURAL

## 2019-06-12 MED ORDER — PHENYLEPHRINE HCL-NACL 10-0.9 MG/250ML-% IV SOLN
INTRAVENOUS | Status: DC | PRN
  Administered 2019-06-12: 30 ug/min via INTRAVENOUS

## 2019-06-12 MED ORDER — ONDANSETRON HCL 4 MG/2ML IJ SOLN: 4.0000 mg | Freq: Four times a day (QID) | INTRAMUSCULAR | Status: DC | PRN

## 2019-06-12 MED ORDER — FENTANYL CITRATE (PF) 100 MCG/2ML IJ SOLN
INTRAMUSCULAR | Status: AC
  Administered 2019-06-12: 50 ug via INTRAVENOUS
  Filled 2019-06-12: qty 2

## 2019-06-12 MED ORDER — HYDROMORPHONE HCL 1 MG/ML IJ SOLN: 0.5000 mg | INTRAMUSCULAR | Status: DC | PRN

## 2019-06-12 MED ORDER — PROPOFOL 500 MG/50ML IV EMUL
INTRAVENOUS | Status: DC | PRN
  Administered 2019-06-12: 100 ug/kg/min via INTRAVENOUS

## 2019-06-12 MED ORDER — METOCLOPRAMIDE HCL 5 MG/ML IJ SOLN: 5.0000 mg | Freq: Three times a day (TID) | INTRAMUSCULAR | Status: DC | PRN

## 2019-06-12 MED ORDER — POVIDONE-IODINE 10 % EX SWAB: 2.0000 "application " | Freq: Once | CUTANEOUS | Status: DC

## 2019-06-12 MED ORDER — DIPHENHYDRAMINE HCL 12.5 MG/5ML PO ELIX: 12.5000 mg | ORAL_SOLUTION | ORAL | Status: DC | PRN

## 2019-06-12 MED ORDER — VANCOMYCIN HCL 1000 MG IV SOLR
INTRAVENOUS | Status: AC
  Filled 2019-06-12: qty 1000

## 2019-06-12 MED ORDER — DOCUSATE SODIUM 100 MG PO CAPS
100.0000 mg | ORAL_CAPSULE | Freq: Two times a day (BID) | ORAL | Status: DC
  Administered 2019-06-12 – 2019-06-13 (×2): 100 mg via ORAL
  Filled 2019-06-12 (×3): qty 1

## 2019-06-12 MED ORDER — MAGNESIUM CITRATE PO SOLN: 1.0000 | Freq: Once | ORAL | Status: DC | PRN

## 2019-06-12 MED ORDER — FENTANYL CITRATE (PF) 250 MCG/5ML IJ SOLN
INTRAMUSCULAR | Status: AC
  Filled 2019-06-12: qty 5

## 2019-06-12 MED ORDER — MIDAZOLAM HCL 2 MG/2ML IJ SOLN
INTRAMUSCULAR | Status: AC
  Filled 2019-06-12: qty 2

## 2019-06-12 SURGICAL SUPPLY — 65 items
APL PRP STRL LF DISP 70% ISPRP (MISCELLANEOUS) ×1
BIT DRILL 10 HOLLOW (BIT) ×1 IMPLANT
BIT DRILL 3.8X270 (BIT) ×1 IMPLANT
BIT DRILL 6.5 CONICAL (BIT) ×1 IMPLANT
BRUSH SCRUB EZ PLAIN DRY (MISCELLANEOUS) ×4 IMPLANT
CHLORAPREP W/TINT 26 (MISCELLANEOUS) ×2 IMPLANT
CLOSURE STERI-STRIP 1/4X4 (GAUZE/BANDAGES/DRESSINGS) ×1 IMPLANT
COVER SURGICAL LIGHT HANDLE (MISCELLANEOUS) ×4 IMPLANT
COVER WAND RF STERILE (DRAPES) ×2 IMPLANT
DRAPE C-ARM 42X72 X-RAY (DRAPES) ×2 IMPLANT
DRAPE C-ARMOR (DRAPES) ×2 IMPLANT
DRAPE IMP U-DRAPE 54X76 (DRAPES) ×2 IMPLANT
DRAPE INCISE IOBAN 66X45 STRL (DRAPES) ×2 IMPLANT
DRAPE U-SHAPE 47X51 STRL (DRAPES) ×2 IMPLANT
DRSG ADAPTIC 3X8 NADH LF (GAUZE/BANDAGES/DRESSINGS) ×2 IMPLANT
DRSG PAD ABDOMINAL 8X10 ST (GAUZE/BANDAGES/DRESSINGS) ×6 IMPLANT
ELECT REM PT RETURN 9FT ADLT (ELECTROSURGICAL)
ELECTRODE REM PT RTRN 9FT ADLT (ELECTROSURGICAL) IMPLANT
GAUZE SPONGE 4X4 12PLY STRL (GAUZE/BANDAGES/DRESSINGS) ×2 IMPLANT
GLOVE BIO SURGEON STRL SZ 6.5 (GLOVE) ×6 IMPLANT
GLOVE BIO SURGEON STRL SZ7.5 (GLOVE) ×8 IMPLANT
GLOVE BIOGEL PI IND STRL 6.5 (GLOVE) ×1 IMPLANT
GLOVE BIOGEL PI IND STRL 7.5 (GLOVE) ×1 IMPLANT
GLOVE BIOGEL PI INDICATOR 6.5 (GLOVE) ×1
GLOVE BIOGEL PI INDICATOR 7.5 (GLOVE) ×1
GOWN STRL REUS W/ TWL LRG LVL3 (GOWN DISPOSABLE) ×2 IMPLANT
GOWN STRL REUS W/ TWL XL LVL3 (GOWN DISPOSABLE) ×1 IMPLANT
GOWN STRL REUS W/TWL LRG LVL3 (GOWN DISPOSABLE) ×4
GOWN STRL REUS W/TWL XL LVL3 (GOWN DISPOSABLE) ×2
GUIDEROD SS 2.5MMX280MM (ROD) ×1 IMPLANT
KIT BASIN OR (CUSTOM PROCEDURE TRAY) ×2 IMPLANT
KIT TURNOVER KIT B (KITS) ×2 IMPLANT
MANIFOLD NEPTUNE II (INSTRUMENTS) ×2 IMPLANT
NAIL CANN 8MM 160 LEFT (Plate) ×1 IMPLANT
NS IRRIG 1000ML POUR BTL (IV SOLUTION) ×2 IMPLANT
PACK TOTAL JOINT (CUSTOM PROCEDURE TRAY) ×2 IMPLANT
PAD ABD 8X10 STRL (GAUZE/BANDAGES/DRESSINGS) ×3 IMPLANT
PAD ARMBOARD 7.5X6 YLW CONV (MISCELLANEOUS) ×4 IMPLANT
SCREW LOCK 4.5X38 TI FT (Screw) ×1 IMPLANT
SCREW LOCK 4.5X40 (Screw) ×2 IMPLANT
SCREW LOCK 4X30 TI (Screw) ×1 IMPLANT
SCREW LOCK FT 40X4.5X3.9XSLF (Screw) IMPLANT
SCREW LOCKING 4.0 34MM (Screw) ×1 IMPLANT
SCREW TI MULTILOC 4.5X42 (Screw) ×1 IMPLANT
STAPLER VISISTAT 35W (STAPLE) ×2 IMPLANT
SUCTION FRAZIER HANDLE 10FR (MISCELLANEOUS) ×2
SUCTION TUBE FRAZIER 10FR DISP (MISCELLANEOUS) ×1 IMPLANT
SUT FIBERWIRE #2 38 REV NDL BL (SUTURE) ×4
SUT FIBERWIRE 2-0 18 17.9 3/8 (SUTURE)
SUT MNCRL AB 3-0 PS2 18 (SUTURE) ×2 IMPLANT
SUT MNCRL AB 4-0 PS2 18 (SUTURE) ×1 IMPLANT
SUT MON AB 2-0 CT1 36 (SUTURE) ×2 IMPLANT
SUT VIC AB 0 CT1 27 (SUTURE) ×4
SUT VIC AB 0 CT1 27XBRD ANBCTR (SUTURE) ×2 IMPLANT
SUT VIC AB 2-0 CT1 27 (SUTURE) ×4
SUT VIC AB 2-0 CT1 27XBRD (SUTURE) ×2 IMPLANT
SUT VIC AB 2-0 CT3 27 (SUTURE) IMPLANT
SUT VIC AB 3-0 SH 27 (SUTURE) ×2
SUT VIC AB 3-0 SH 27X BRD (SUTURE) IMPLANT
SUTURE FIBERWR 2-0 18 17.9 3/8 (SUTURE) IMPLANT
SUTURE FIBERWR#2 38 REV NDL BL (SUTURE) IMPLANT
TAPE CLOTH SURG 6X10 WHT LF (GAUZE/BANDAGES/DRESSINGS) ×1 IMPLANT
TRAY FOLEY MTR SLVR 16FR STAT (SET/KITS/TRAYS/PACK) IMPLANT
WATER STERILE IRR 1000ML POUR (IV SOLUTION) ×2 IMPLANT
YANKAUER SUCT BULB TIP NO VENT (SUCTIONS) ×2 IMPLANT

## 2019-06-12 NOTE — Transfer of Care (Signed)
Immediate Anesthesia Transfer of Care Note  Patient: Tayten Ressel  Procedure(s) Performed: INTRAMEDULLARY (IM) NAIL HUMERAL (Left Arm Upper)  Patient Location: PACU  Anesthesia Type:General  Level of Consciousness: awake and drowsy  Airway & Oxygen Therapy: Patient Spontanous Breathing and Patient connected to face mask oxygen  Post-op Assessment: Report given to RN and Post -op Vital signs reviewed and stable  Post vital signs: Reviewed and stable  Last Vitals:  Vitals Value Taken Time  BP 130/66 06/12/19 1351  Temp    Pulse 64 06/12/19 1350  Resp 14 06/12/19 1354  SpO2 91 % 06/12/19 1350  Vitals shown include unvalidated device data.  Last Pain:  Vitals:   06/12/19 0800  TempSrc:   PainSc: 0-No pain         Complications: No apparent anesthesia complications

## 2019-06-12 NOTE — Progress Notes (Signed)
  Date: 06/12/2019  Patient name: Derrick Odonnell  Medical record number: [id number]  Date of birth: April 15, 1926   I have seen and evaluated Derrick Odonnell and discussed their care with the Residency Team. Briefly, Derrick Odonnell is a 86 year old man with PMH of hypothyroidism and Afib who tripped and broke his humerus.  He is undergoing surgical correction today.    Vitals:   06/12/19 1505 06/12/19 1542  BP: (!) 154/78 (!) 144/94  Pulse: 67 71  Resp: 16 16  Temp: (!) 97.5 F (36.4 C) 97.7 F (36.5 C)  SpO2: 95% 95%   General: Seen in PACU, sleepy after sedation CV: RR, NR, no murmur Pulm: CTAB, no wheezing MSK: Arm in sling and bandaged, moving fingers, warm extremities Skin: Bruising on left hand  Assessment and Plan: I have seen and evaluated the patient as outlined above. I agree with the formulated Assessment and Plan as detailed in the residents' note, with the following changes:   1. Humerus Fracture - Consider testing for osteoporosis given low impact fracture - Pain and nausea control - PT/OT  Other issues per Dr. Otis Brace daily note.   Sid Falcon, MD 4/23/20215:00 PM

## 2019-06-12 NOTE — Anesthesia Procedure Notes (Signed)
Procedure Name: LMA Insertion Date/Time: 06/12/2019 12:23 PM Performed by: Griffin Dakin, CRNA Pre-anesthesia Checklist: Patient identified, Emergency Drugs available, Suction available and Patient being monitored Patient Re-evaluated:Patient Re-evaluated prior to induction Oxygen Delivery Method: Circle system utilized Preoxygenation: Pre-oxygenation with 100% oxygen Induction Type: IV induction LMA: LMA inserted LMA Size: 5.0 Tube type: Oral Number of attempts: 1 Placement Confirmation: positive ETCO2 and breath sounds checked- equal and bilateral Tube secured with: Tape Dental Injury: Teeth and Oropharynx as per pre-operative assessment

## 2019-06-12 NOTE — Progress Notes (Signed)
OT Cancellation Note  Patient Details Name: Chancelor Lichtenstein MRN: [id number] DOB: 1926/08/11   Cancelled Treatment:    Reason Eval/Treat Not Completed: Patient at procedure or test/ unavailable.  Pt in the OR.  Will follow up tomorrow.  Nilsa Nutting., OTR/L Acute Rehabilitation Services Pager [phone] Office [phone]   Lucille Passy M 06/12/2019, 11:10 AM

## 2019-06-12 NOTE — Op Note (Signed)
Orthopaedic Surgery Operative Note (CSN: [id number])  Derrick Odonnell  03/30/1926 Date of Surgery: 06/11/2019 - 06/12/2019   Diagnoses:  left 2 part proximal humerus fracture  Procedure: Left proximal humerus fracture IM nail   Operative Finding Successful completion of the planned procedure.  Good fixation proximally and distally, cuff repair was repair.    Post-operative plan: The patient will be NWB in a sling 4 weeks with gentle transition afterwards.  Therapy can start after first postop visit.  The patient will be readmitted to the floor if necessary and can be discharged as soon as the medical team sees fit.  DVT prophylaxis not indicated in this ambulatory upper extremity patient without significant risk factors.   Pain control with PRN pain medication preferring oral medicines.  Follow up plan will be scheduled in approximately 7 days for incision check and XR.  Post-Op Diagnosis: Same Surgeons:Primary: Hiram Gash, MD Assistants:Caroline McBane PA-C Location: [facility] OR ROOM 08 Anesthesia: General with interscalene block Antibiotics: Ancef 2 g with local vancomycin powder 1 g at the surgical site Tourniquet time: * No tourniquets in log * Estimated Blood Loss: 50 Complications: None Specimens: None Implants: Implant Name Type Inv. Item Serial No. Manufacturer Lot No. LRB No. Used Action  63mm TI MULTILOC PROX HUMERAL NAIL/LEFT/CANN/178mm-STER    DEPUY SYNTHES [id number] Left 1 Implanted    Indications for Surgery:   Derrick Odonnell is a 86 y.o. male with fall resulting in a 2 part proximal humerus fracture.  Fracture was significantly displaced on x-ray and we worried that it would continue to displace based on the nature of its location.  Benefits and risks of operative and nonoperative management were discussed prior to surgery with patient/guardian(s) and informed consent form was completed.  Specific risks including infection, need for additional surgery, nonunion, malunion, hardware  prominence, pain   Procedure:   The patient was identified properly. Informed consent was obtained and the surgical site was marked. The patient was taken up to suite where general anesthesia was induced.  The patient was positioned beachchair on Allen table with a spider arm holder.  The left  Shoulder was prepped and draped in the usual sterile fashion.  Timeout was performed before the beginning of the case.  We began with a mini open approach to the anterolateral humerus using a 3 cm incision.  This was centered starting just proximal to the anterolateral corner of the acromion.  We went to skin sharply achieving hemostasis we progressed.  We identified the deltoid fascia and opened it in line with our incision in its fibers.  We able to split the fibers bluntly not detaching any of the humerus.  We did a bursectomy and identified that the cuff was intact.  We were able to split the cuff in line with its fibers and identified the biceps tendon which was intact.  We retracted this out of the way.  At this point we used fluoroscopic imaging to place a starting pin at the most proximal aspect of the humerus on the articular margin central to the cuff insertion.  We placed a guidewire at this point and then overreamed with the entry reamer from the [employer] set.  That point were able to place a guidewire down the fracture and were able to align the fracture fragments.  We then selected a 8 mm nail and were able to place it without issue obtaining orthogonal images we progressed.  We used the outrigger to place 3 proximal screws in typical  fashion using percutaneously placed incisions and spreading through the deep surfaces down to bone.  We had good fixation of these 3 screws.  We placed one distal interlock in a similar fashion.  Around the world films demonstrated no perforation of the joint surface and good fixation of the proximal distal aspects of the nail.  We irrigated copiously before closing  the side to side rotator cuff split with #2 FiberWire.  We again irrigated before placing local vancomycin powder and closing the incision in a multilayer fashion with absorbable sutures.  Sterile dressing was placed and the patient was placed in a sling.  Patient awoken taken to PACU in stable condition.  Noemi Chapel, PA-C, present and scrubbed throughout the case, critical for completion in a timely fashion, and for retraction, instrumentation, closure.

## 2019-06-12 NOTE — Progress Notes (Signed)
Patient with 3 episodes of  HR in the 140's nonsustained. Pt is asymptomatic. Denies SOB or CP. O2 sat in the upper 90's. Denies pain on Lt shoulder. MD notified and will come to assess patient. Will cont to monitor.

## 2019-06-12 NOTE — Anesthesia Procedure Notes (Signed)
Anesthesia Regional Block: Interscalene brachial plexus block   Pre-Anesthetic Checklist: ,, timeout performed, Correct Patient, Correct Site, Correct Laterality, Correct Procedure, Correct Position, site marked, Risks and benefits discussed,  Surgical consent,  Pre-op evaluation,  At surgeon's request and post-op pain management  Laterality: Left  Prep: chloraprep       Needles:  Injection technique: Single-shot  Needle Type: Echogenic Stimulator Needle     Needle Length: 9cm  Needle Gauge: 21   Needle insertion depth: 6 cm   Additional Needles:   Procedures:,,,, ultrasound used (permanent image in chart),,,,  Narrative:  Start time: 06/12/2019 11:30 AM End time: 06/12/2019 11:35 AM Injection made incrementally with aspirations every 5 mL.  Performed by: Personally  Anesthesiologist: Josephine Igo, MD  Additional Notes: Timeout performed. Patient sedated. Relevant anatomy ID'd using [place]. Incremental 2-65ml injection of LA with frequent aspiration. Patient tolerated procedure well.        Left Interscalene Block

## 2019-06-12 NOTE — Progress Notes (Signed)
   Subjective:  Derrick Odonnell is a 86 y.o. with PMH of hypothyroidism, paroxysmal A. fib admitted after a fall and found to have a displace fracture of L Humerus now s/p on hospital day 1  Derrick Odonnell is seen in PACU. He is still coming out of sedation. Opens eyes to command and squeezes hand.   Objective:  Vital signs in last 24 hours: Vitals:   06/11/19 1930 06/11/19 1945 06/11/19 2005 06/12/19 0527  BP: (!) 150/83 138/80 136/83 119/67  Pulse: 72 77 81 76  Resp: 15 17 16 16   Temp:  98 F (36.7 C) 98.1 F (36.7 C) 98.8 F (37.1 C)  TempSrc:  Oral Oral Oral  SpO2: 100% 97% 100% 99%  Weight:      Height:        General: NAD, nl appearance Cardiovascular: Normal rate, regular rhythm.  No murmurs, rubs, or gallops Pulmonary : Effort normal, breath sounds normal. No wheezes, rales, or rhonchi Abdominal: soft, nontender,  bowel sounds present Musculoskeletal: sling on left hand, moving left hand Skin: Warm, dry    Assessment/Plan:  Active Problems:   Closed left arm fracture  Derrick Odonnell is a 86 year old male with past medical history of hypothyroidism and paroxysmal A. fib admitted for left humeral neck fracture.  Unstable, displaced fracture plus femoral neck No as needed pain medications used overnight. ORIF today.  No complications reported.  Plan: - Pain control with Percocet plus Dilaudid for breakthrough  Hypothyroidism On 88 MCG levothyroxine at home.  TSH in normal limits Plan: -Continue with levothyroxine 88 MCG  History of paroxysmal A. Fib History of irregular heart rate in the past, but reports this problem is resolved.  -Telemetry  DVT prophx: SCDs Diet: NPO Bowel: MiraLAX as needed Code: Full  Dispo: Anticipated discharge in approximately 1-2 day(s).   Tamsen Snider, MD PGY1

## 2019-06-12 NOTE — Interval H&P Note (Signed)
History and Physical Interval Note:  06/12/2019 12:05 PM  Derrick Odonnell  has presented today for surgery, with the diagnosis of left 2 part proximal humerus fracture.  The various methods of treatment have been discussed with the patient and family. After consideration of risks, benefits and other options for treatment, the patient has consented to  Procedure(s): INTRAMEDULLARY (IM) NAIL HUMERAL (Left) as a surgical intervention.  The patient's history has been reviewed, patient examined, no change in status, stable for surgery.  I have reviewed the patient's chart and labs.  Questions were answered to the patient's satisfaction.     Hiram Gash

## 2019-06-12 NOTE — Anesthesia Preprocedure Evaluation (Addendum)
Anesthesia Evaluation  Patient identified by MRN, date of birth, ID band Patient awake    Reviewed: Allergy & Precautions, NPO status , Patient's Chart, lab work & pertinent test results  Airway Mallampati: II  TM Distance: >3 FB Neck ROM: Full    Dental  (+) Edentulous Upper, Edentulous Lower   Pulmonary former smoker,    Pulmonary exam normal breath sounds clear to auscultation       Cardiovascular negative cardio ROS Normal cardiovascular exam Rhythm:Regular Rate:Normal  EKG 06/11/19- NSR, Old anterior infarct   Neuro/Psych Very HOH negative psych ROS   GI/Hepatic negative GI ROS, Neg liver ROS,   Endo/Other  Hypothyroidism Hyperlipidemia  Renal/GU Renal disease  negative genitourinary   Musculoskeletal Left proximal humerus Fx   Abdominal   Peds  Hematology  (+) anemia ,   Anesthesia Other Findings   Reproductive/Obstetrics                           Anesthesia Physical Anesthesia Plan  ASA: II  Anesthesia Plan: MAC and Regional   Post-op Pain Management:    Induction: Intravenous  PONV Risk Score and Plan: 2 and Ondansetron and Treatment may vary due to age or medical condition  Airway Management Planned: Natural Airway, Simple Face Mask and Nasal Cannula  Additional Equipment:   Intra-op Plan:   Post-operative Plan:   Informed Consent: I have reviewed the patients History and Physical, chart, labs and discussed the procedure including the risks, benefits and alternatives for the proposed anesthesia with the patient or authorized representative who has indicated his/her understanding and acceptance.     Dental advisory given  Plan Discussed with: CRNA and Surgeon  Anesthesia Plan Comments:         Anesthesia Quick Evaluation

## 2019-06-12 NOTE — Discharge Instructions (Signed)
Derrick Charter MD, MPH [place] [address] [phone] (fax)   [place] . You may remove the Operative Dressing on Post-Op Day #3 (72hrs after surgery).  Alternatively if you would like you can leave dressing on until follow-up if within 7-8 days but keep it dry. . Leave steri-strips in place until they fall off on their own, usually 2 weeks postop. . There may be a small amount of fluid/bleeding leaking at the surgical site. This is normal; You may change/reinforce the bandage as needed.  . Use the Cryocuff or Ice as often as possible for the first 7 days, then as needed for pain relief. Always keep a towel, ACE wrap or other barrier between the cooling unit and your skin.  . You may shower on Post-Op Day #3. Gently pat the area dry. Do not soak the shoulder in water or submerge it. Keep dry incisions as dry as possible. . Do not go swimming in the pool or ocean until 4 weeks after surgery or when otherwise instructed.    EXERCISES/BRACING ? Sling should be used at all times until follow-up.  ? You can remove sling for hygiene.    . Please continue to ambulate and do not stay sitting or lying for too long. Perform foot and wrist pumps to assist in circulation.  POST-OP MEDICATIONS- Multimodal approach to pain control . In general your pain will be controlled with a combination of substances.  Prescriptions unless otherwise discussed are electronically sent to your pharmacy.  This is a carefully made plan we use to minimize narcotic use.    ? Acetaminophen - Non-narcotic pain medicine taken on a scheduled basis  ? Oxycodone - This is a strong narcotic, to be used only on an "as needed" basis for pain. ? Zofran - take as needed for nausea  FOLLOW-UP . If you develop a Fever (?101.5), Redness or Drainage from the surgical incision site, please call our office to arrange for an  evaluation. . Please call the office to schedule a follow-up appointment for your suture removal, 10-14 days post-operatively.    HELPFUL INFORMATION  . If you had a block, it will wear off between 8-24 hrs postop typically.  This is period when your pain may go from nearly zero to the pain you would have had postop without the block.  This is an abrupt transition but nothing dangerous is happening.  You may take an extra dose of narcotic when this happens.  . You may be more comfortable sleeping in a semi-seated position the first few nights following surgery.  Keep a pillow propped under the elbow and forearm for comfort.  If you have a recliner type of chair it might be beneficial.  If not that is fine too, but it would be helpful to sleep propped up with pillows behind your operated shoulder as well under your elbow and forearm.  This will reduce pulling on the suture lines.  . When dressing, put your operative arm in the sleeve first.  When getting undressed, take your operative arm out last.  Loose fitting, button-down shirts are recommended.  Often in the first days after surgery you may be more comfortable keeping your operative arm under your shirt and not through the sleeve.  . You may return to work/school in the next couple of days when you feel up to it.  Desk work and typing in the  sling is fine.  . We suggest you use the pain medication the first night prior to going to bed, in order to ease any pain when the anesthesia wears off. You should avoid taking pain medications on an empty stomach as it will make you nauseous.  . You should wean off your narcotic medicines as soon as you are able.  Most patients will be off or using minimal narcotics before their first postop appointment.   . Do not drink alcoholic beverages or take illicit drugs when taking pain medications.  . It is against the law to drive while taking narcotics.  In some states it is against the law to drive while  your arm is in a sling.   . Pain medication may make you constipated.  Below are a few solutions to try in this order: - Decrease the amount of pain medication if you aren't having pain. - Drink lots of decaffeinated fluids. - Drink prune juice and/or each dried prunes  . If the first 3 don't work start with additional solutions - Take Colace - an over-the-counter stool softener - Take Senokot - an over-the-counter laxative - Take Miralax - a stronger over-the-counter laxative

## 2019-06-12 NOTE — Progress Notes (Signed)
I discussed case with patient at length.  He has a displaced humeral fracture that will likely go on to nonunion or malunion of the high rate without surgical intervention.  Talked about intramedullary nailing the patient elected to proceed.  All questions were answered.  We discussed the possibility of cuff failure, proximal humeral fracture and continued pain amongst others.

## 2019-06-13 LAB — BASIC METABOLIC PANEL
Anion gap: 8 (ref 5–15)
BUN: 16 mg/dL (ref 8–23)
CO2: 22 mmol/L (ref 22–32)
Calcium: 7.7 mg/dL — ABNORMAL LOW (ref 8.9–10.3)
Chloride: 105 mmol/L (ref 98–111)
Creatinine, Ser: 1.09 mg/dL (ref 0.61–1.24)
GFR calc Af Amer: >60 mL/min (ref >60)
GFR calc non Af Amer: 59 mL/min — ABNORMAL LOW (ref >60)
Glucose, Bld: 101 mg/dL — ABNORMAL HIGH (ref 70–99)
Potassium: 3.8 mmol/L (ref 3.5–5.1)
Sodium: 135 mmol/L (ref 135–145)

## 2019-06-13 LAB — CBC
HCT: 35.1 % — ABNORMAL LOW (ref 39.0–52.0)
Hemoglobin: 11.4 g/dL — ABNORMAL LOW (ref 13.0–17.0)
MCH: 30.2 pg (ref 26.0–34.0)
MCHC: 32.5 g/dL (ref 30.0–36.0)
MCV: 92.9 fL (ref 80.0–100.0)
Platelets: 186 10*3/uL (ref 150–400)
RBC: 3.78 MIL/uL — ABNORMAL LOW (ref 4.22–5.81)
RDW: 12.2 % (ref 11.5–15.5)
WBC: 8.5 10*3/uL (ref 4.0–10.5)
nRBC: 0 % (ref 0.0–0.2)

## 2019-06-13 MED ORDER — LACTATED RINGERS IV BOLUS
500.0000 mL | Freq: Once | INTRAVENOUS | Status: AC
  Administered 2019-06-13: 500 mL via INTRAVENOUS

## 2019-06-13 MED ORDER — SODIUM CHLORIDE 0.9 % IV BOLUS
500.0000 mL | Freq: Once | INTRAVENOUS | Status: AC
  Administered 2019-06-13: 500 mL via INTRAVENOUS

## 2019-06-13 NOTE — Progress Notes (Signed)
notified on call phys- HR creeping up even at rest sustaining- 120's  To 130's assymptomatic- block still in effect- afib on monitor- orders obtained for ECG- and fluid bolus.

## 2019-06-13 NOTE — Progress Notes (Addendum)
   Subjective:  Derrick Odonnell is a 86 y.o. with PMH of hypothyroidism, paroxysmal A. fib admitted after a fall and found to have a displace fracture of L Humerus now s/p on hospital day 2  Derrick Odonnell was examined and evaluated at bedside this am. He was observed to be tangled up with telemetry wires. He mentions that on admission, he fell and hit his shoulder while trying to fix his mailbox in his shed. He mentions that his post-surgical pain is tolerable.  Objective:  Vital signs in last 24 hours: Vitals:   06/12/19 2028 06/13/19 0010 06/13/19 0210 06/13/19 0413  BP: 128/83 (!) 151/95 (!) 149/93 125/80  Pulse: 81 (!) 131 (!) 126 76  Resp: 18 18 18 16   Temp: 99 F (37.2 C) 99.2 F (37.3 C) 99.2 F (37.3 C) 98.2 F (36.8 C)  TempSrc: Oral   Oral  SpO2: 100% 94% 93% 99%  Weight:      Height:        General: NAD, nl appearance Cardiovascular: tacjycardoc, regular rhythm.  No murmurs, rubs, or gallops Pulmonary : Effort normal, breath sounds normal. No wheezes, rales, or rhonchi Abdominal: soft, nontender,  bowel sounds present Musculoskeletal: sling on left arm, moving arm and hand, distal pulse intact and left hand warm Skin: Warm, dry    Assessment/Plan:  Active Problems:   Closed left arm fracture   Fall  Derrick Odonnell is a 86 year old male with past medical history of hypothyroidism and paroxysmal A. fib admitted for left humeral neck fracture.  Closed left proximal humerus fracture s/p ORIF on 4/23 No as needed pain medications used overnight. Pain controlled with tylenol  Plan: - Pain control with Tylenol, Oxycodone for moderate to severe pain  - Follow up ortho surgery recommendations - PT/OT  Hypothyroidism On 88 MCG levothyroxine at home.  TSH in normal limits Plan: -Continue with levothyroxine 88 MCG  History of paroxysmal A. Fib Tachycardia History of irregular heart rate in the past, but reports this problem is resolved. Sinus tachycardia on ECG. Given 500 ml  bolus overnight and continued 75cc/hr normal sailine Plan: - Stop fluids - Give LR bolus - monitor HR - monitor I/O's - Encourage PO intake  DVT prophx: SCDs Diet: regular Bowel: MiraLAX as needed Code: Full  Dispo: Anticipated discharge in approximately 1-2 day(s).   Tamsen Snider, MD PGY1

## 2019-06-13 NOTE — Evaluation (Signed)
Occupational Therapy Evaluation Patient Details Name: Derrick Odonnell MRN: [id number] DOB: [birth year] Today's Date: 06/13/2019    History of Present Illness Pt isa 86 y/o male with PMH of hypothyroidism, paroxysmal a fib, prostate ca, presenting after a fall when he tripped and fell onto his L arm.  Found with L proximal humerus fx, s/p L proximal humerus IM nail 06/13/19.    Clinical Impression   PTA patient reports independent and driving. Admitted for above and limited by problem list below, including impaired balance, decreased activity tolerance, decreased functional use and NWB of L UE in sling.  He was educated on sling mgmt and wear schedule, positioning of L UE, exercises for hand/wrist in sling, safety and recommendations.  He requires min-max assist for ADLs, min assist for transfers and min guard for in room mobility.  Pt endorses difficulty with STM, but noted poor safety awareness and problem solving today.  He also reports double vision at times, but reports it is random when it happens. Agreeable to recommendations for support with transfers, ADLs and mobility at this time, reports his spouse/daugthers can assist at dc.  He will benefit from further OT services while admitted and after dc at [facility] level to optimize independence and safety with ADLs, IADLs.     Follow Up Recommendations  Home health OT;Supervision/Assistance - 24 hour    Equipment Recommendations  3 in 1 bedside commode    Recommendations for Other Services PT consult     Precautions / Restrictions Precautions Precautions: Fall;Shoulder Type of Shoulder Precautions: no ROM at shoulder, NWB  Shoulder Interventions: Shoulder sling/immobilizer;At all times Precaution Booklet Issued: No Precaution Comments: reviewed precautions with patient  Restrictions Weight Bearing Restrictions: Yes LUE Weight Bearing: Non weight bearing      Mobility Bed Mobility Overal bed mobility: Needs Assistance Bed Mobility:  Supine to Sit     Supine to sit: Min guard     General bed mobility comments: for safety, line mgmt   Transfers Overall transfer level: Needs assistance Equipment used: None Transfers: Sit to/from Stand Sit to Stand: Min assist         General transfer comment: min assist to steady    Balance Overall balance assessment: Needs assistance Sitting-balance support: No upper extremity supported;Feet supported Sitting balance-Leahy Scale: Good     Standing balance support: No upper extremity supported;During functional activity Standing balance-Leahy Scale: Fair Standing balance comment: mild unsteadiness noted                           ADL either performed or assessed with clinical judgement   ADL Overall ADL's : Needs assistance/impaired     Grooming: Set up;Sitting   Upper Body Bathing: Moderate assistance;Sitting   Lower Body Bathing: Moderate assistance;Sit to/from stand   Upper Body Dressing : Moderate assistance;Sitting   Lower Body Dressing: Moderate assistance;Sit to/from stand   Toilet Transfer: Minimal assistance;Ambulation Toilet Transfer Details (indicate cue type and reason): simulated to recliner          Functional mobility during ADLs: Minimal assistance;Min guard;Cueing for safety General ADL Comments: pt limited by impaired balance, L UE precautions and decreased functional use, decreased safety awareness     Vision Baseline Vision/History: Cataracts(and double vision?) Patient Visual Report: No change from baseline Additional Comments: appears WFL, pt reports cataracts removed but has double vision randomlly (more at night)?      Perception     Praxis  Pertinent Vitals/Pain Pain Assessment: Faces Faces Pain Scale: Hurts little more Pain Location: L shoulder  Pain Descriptors / Indicators: Discomfort;Grimacing;Operative site guarding Pain Intervention(s): Limited activity within patient's tolerance;Monitored during  session;Repositioned     Hand Dominance Right   Extremity/Trunk Assessment Upper Extremity Assessment Upper Extremity Assessment: LUE deficits/detail LUE Deficits / Details: s/p proximal humerus ORIF, in sling: WFL hand, wrist, forearm  LUE: Unable to fully assess due to immobilization LUE Sensation: WNL LUE Coordination: decreased fine motor;decreased gross motor   Lower Extremity Assessment Lower Extremity Assessment: Defer to PT evaluation       Communication Communication Communication: HOH   Cognition Arousal/Alertness: Awake/alert Behavior During Therapy: WFL for tasks assessed/performed Overall Cognitive Status: No family/caregiver present to determine baseline cognitive functioning Area of Impairment: Memory;Safety/judgement;Awareness;Problem solving                     Memory: Decreased short-term memory   Safety/Judgement: Decreased awareness of deficits;Decreased awareness of safety Awareness: Emergent Problem Solving: Slow processing;Difficulty sequencing;Requires verbal cues General Comments: pt endorses difficutly with STM at baseline, poor awareness of safety and deficits    General Comments  HR 70-80s, soft BP once seated in recliner     Exercises     Shoulder Instructions      Home Living Family/patient expects to be discharged to:: Private residence Living Arrangements: Spouse/significant other Available Help at Discharge: Family;Available 24 hours/day Type of Home: House Home Access: Stairs to enter [employer] of Steps: 2-3   Home Layout: Two level;Able to live on main level with bedroom/bathroom     Bathroom Shower/Tub: Occupational psychologist: Standard     Home Equipment: None   Additional Comments: daughter lives next door      Prior Functioning/Environment Level of Independence: Independent                 OT Problem List: Decreased strength;Decreased activity tolerance;Impaired balance  (sitting and/or standing);Decreased safety awareness;Decreased knowledge of use of DME or AE;Decreased knowledge of precautions;Impaired UE functional use;Pain      OT Treatment/Interventions: Self-care/ADL training;Energy conservation;DME and/or AE instruction;Therapeutic activities;Patient/family education;Balance training;Therapeutic exercise    OT Goals(Current goals can be found in the care plan section) Acute Rehab OT Goals Patient Stated Goal: to get home  OT Goal Formulation: With patient Time For Goal Achievement: 06/27/19 Potential to Achieve Goals: Good  OT Frequency: Min 2X/week   Barriers to D/C:            Co-evaluation              AM-PAC OT "6 Clicks" Daily Activity     Outcome Measure Help from another person eating meals?: A Little Help from another person taking care of personal grooming?: A Little Help from another person toileting, which includes using toliet, bedpan, or urinal?: A Lot Help from another person bathing (including washing, rinsing, drying)?: A Lot Help from another person to put on and taking off regular upper body clothing?: A Lot Help from another person to put on and taking off regular lower body clothing?: A Lot 6 Click Score: 14   End of Session Equipment Utilized During Treatment: Gait belt Nurse Communication: Mobility status;Precautions  Activity Tolerance: Patient tolerated treatment well Patient left: in chair;with call bell/phone within reach;with chair alarm set;with nursing/sitter in room  OT Visit Diagnosis: Muscle weakness (generalized) (M62.81);Unsteadiness on feet (R26.81);Pain;History of falling (Z91.81) Pain - Right/Left: Left Pain - part of body: Shoulder;Arm  Time: [id number] OT Time Calculation (min): 33 min Charges:  OT General Charges $OT Visit: 1 Visit OT Evaluation $OT Eval Moderate Complexity: 1 Mod OT Treatments $Self Care/Home Management : 8-22 mins  Jolaine Artist, OT Acute Rehabilitation  Services Pager [phone] Office [phone]   Delight Stare 06/13/2019, 12:54 PM

## 2019-06-13 NOTE — Progress Notes (Signed)
OT Cancellation Note  Patient Details Name: Quintel Delfierro MRN: [id number] DOB: 06/08/1926   Cancelled Treatment:    Reason Eval/Treat Not Completed: Medical issues which prohibited therapy, HR 130 supine in bed resting. RN aware and present.  Will follow and see as able.   Jolaine Artist, OT Acute Rehabilitation Services Pager [phone] Office [phone]    Delight Stare 06/13/2019, 9:28 AM

## 2019-06-13 NOTE — Progress Notes (Addendum)
Provided discharge education/instructions, all questions and concerns addressed, wound care instructions given, Pt not in distress, denies dizziness and lightheadedness, no pain at this time, discharged home with belongings accompanied by daughter and wife.

## 2019-06-13 NOTE — TOC Transition Note (Signed)
Transition of Care [facility]) - CM/SW Discharge Note   Patient Details  Name: Derrick Odonnell MRN: [id number] Date of Birth: 22-May-1926  Transition of [facility]) CM/SW Contact:  Claudie Leach, RN 06/13/2019, 3:02 PM   Clinical Narrative:    Patient to dc home with support of wife and children. Discussed HH with daughter and wife.  They have no preference of agency.  Referral accepted by [facility] with Alvis Lemmings.  Family does not want to wait for 3n1.  They will obtain elsewhere.  Final next level of care: [place] Barriers to Discharge: No Barriers Identified   Patient Goals and CMS Choice Patient states their goals for this hospitalization and ongoing recovery are:: to get well CMS Medicare.gov Compare Post Acute Care list provided to:: Patient Choice offered to / list presented to : Patient   Discharge Plan and Services                DME Arranged: 3-N-1 DME Agency: [employer] Date DME Agency Contacted: 06/13/19 Time DME Agency Contacted: 1501 Representative spoke with at DME Agency: [place]: PT, OT [place] Agency: [place] Date [place]: 06/13/19 Time [place]: 1501 Representative spoke with at [place]: Tommi Rumps

## 2019-06-13 NOTE — Progress Notes (Signed)
HR at 09:28=130, checked apical=123. Pt asymptomatic, denies chest pain, not in distress, resting comfortably in bed. MD notified, no new orders received. At 09:38 HR=84. Will continue to monitor.

## 2019-06-13 NOTE — Progress Notes (Signed)
[facility] Progress Note Weekend Coverage   Patient ID: Derrick Odonnell MRN: [id number] DOB/AGE: 1926-06-01 86 y.o.  Subjective:  Doing well No complaints L arm feels ok    ROS As above  Objective:   VITALS:   Vitals:   06/13/19 0938 06/13/19 1101 06/13/19 1242 06/13/19 1336  BP:  (!) 93/55 (!) 109/51 (!) 95/47  Pulse: 84 76 75 78  Resp:  16 17 18   Temp:  98.2 F (36.8 C) (!) 97.5 F (36.4 C) 98.2 F (36.8 C)  TempSrc:  Oral Oral Oral  SpO2:  100% 99% 99%  Weight:      Height:        Estimated body mass index is 22.27 kg/m as calculated from the following:   Height as of this encounter: 5\' 6"  (1.676 m).   Weight as of this encounter: 62.6 kg.   Intake/Output      04/23 0701 - 04/24 0700 04/24 0701 - 04/25 0700   P.O.     I.V. (mL/kg) 1000 (16) 344.5 (5.5)   Other 100    IV Piggyback 100 500.7   Total Intake(mL/kg) 1200 (19.2) 845.2 (13.5)   Urine (mL/kg/hr) 1600 (1.1)    Blood 100    Total Output 1700    Net -500 +845.2        Urine Occurrence  1 x   Stool Occurrence  1 x     LABS  Results for orders placed or performed during the hospital encounter of 06/11/19 (from the past 24 hour(s))  Basic metabolic panel     Status: Abnormal   Collection Time: 06/13/19  8:58 AM  Result Value Ref Range   Sodium 135 135 - 145 mmol/L   Potassium 3.8 3.5 - 5.1 mmol/L   Chloride 105 98 - 111 mmol/L   CO2 22 22 - 32 mmol/L   Glucose, Bld 101 (H) 70 - 99 mg/dL   BUN 16 8 - 23 mg/dL   Creatinine, Ser 1.09 0.61 - 1.24 mg/dL   Calcium 7.7 (L) 8.9 - 10.3 mg/dL   GFR calc non Af Amer 59 (L) >60 mL/min   GFR calc Af Amer >60 >60 mL/min   Anion gap 8 5 - 15  CBC     Status: Abnormal   Collection Time: 06/13/19  8:58 AM  Result Value Ref Range   WBC 8.5 4.0 - 10.5 K/uL   RBC 3.78 (L) 4.22 - 5.81 MIL/uL   Hemoglobin 11.4 (L) 13.0 - 17.0 g/dL   HCT 35.1 (L) 39.0 - 52.0 %   MCV  92.9 80.0 - 100.0 fL   MCH 30.2 26.0 - 34.0 pg   MCHC 32.5 30.0 - 36.0 g/dL   RDW 12.2 11.5 - 15.5 %   Platelets 186 150 - 400 K/uL   nRBC 0.0 0.0 - 0.2 %     PHYSICAL EXAM:   Gen: sitting up in chair, very pleasant  Lungs: unlabored  Ext:       Left Upper Extremity   Dressing with some strike through but stable and intact  Sling in place  Mild swelling distally   Pt still has wedding ring off, instructed family to remove it   Motor and sensory functions intact  Ext warm   + Radial pulse   Assessment/Plan: 1  Day Post-Op   Active Problems:   Closed left arm fracture   Fall   Anti-infectives (From admission, onward)   Start     Dose/Rate Route Frequency Ordered Stop   06/12/19 1800  ceFAZolin (ANCEF) IVPB 1 g/50 mL premix     1 g 100 mL/hr over 30 Minutes Intravenous Every 6 hours 06/12/19 1532 06/13/19 0544   06/12/19 1100  ceFAZolin (ANCEF) IVPB 2g/100 mL premix     2 g 200 mL/hr over 30 Minutes Intravenous On call to O.R. 06/12/19 1052 06/12/19 1225   06/12/19 1055  ceFAZolin (ANCEF) 2-4 GM/100ML-% IVPB    Note to Pharmacy: Lisette Grinder   : cabinet override      06/12/19 1055 06/12/19 1318    .  POD/HD#: 1  86 y/o male with L proximal humerus fracture  - L proximal humerus fracture s/p IMN   Sling at all times   NWB   Ok to do elbow, forearm, wrist and hand motion when seated in chair or in bed   Wound care starting on 06/15/2019, instructions in dc paperwork   Follow up with dr Griffin Basil in 1 week    - Dispo:  Ok to [employer] from ortho standpoint     Jari Pigg, PA-C [phone] (C) 06/13/2019, 2:58 PM  [facility] [place] [address] [phone] (F)

## 2019-06-13 NOTE — Progress Notes (Signed)
Occupational Therapy Treatment Patient Details Name: Derrick Odonnell MRN: [id number] DOB: Feb 18, 1927 Today's Date: 06/13/2019    History of present illness Derrick Odonnell is a 86 yo M w/ PMH of hypothyroidism, paroxysmal A.fib,  who presents to [facility] after a fall where he tripped inhis shop and fell on his L arm. Found to have L proximal humerus fx; s/p L proximal humerus IMN (06/13/19)   OT comments  Returned to see patient and provide patient/daughters with further education on L humerus ORIF.  Reviewed sling mgmt/positioning/wear schedule and ADL compensatory techniques, exercises and safety.  Daughters report he will have 24/7 support. Pt with no recall of education this am, provided handout. No further questions or concerns, they report plan to dc home today.    Follow Up Recommendations  Home health OT;Supervision/Assistance - 24 hour    Equipment Recommendations  3 in 1 bedside commode    Recommendations for Other Services      Precautions / Restrictions Precautions Precautions: Fall;Shoulder Type of Shoulder Precautions: no ROM at shoulder, NWB  Shoulder Interventions: Shoulder sling/immobilizer;At all times Precaution Booklet Issued: Yes (comment) Precaution Comments: reviewed with patient and daughters Restrictions Weight Bearing Restrictions: Yes LUE Weight Bearing: Non weight bearing       Mobility Bed Mobility               General bed mobility comments: OOB in recliner  Transfers Overall transfer level: Needs assistance Equipment used: None Transfers: Sit to/from Stand Sit to Stand: Min guard         General transfer comment: min guard for safety, no overt LOB noted, no physical assist required    Balance Overall balance assessment: Needs assistance Sitting-balance support: No upper extremity supported;Feet supported Sitting balance-Leahy Scale: Good     Standing balance support: No upper extremity supported;During functional activity;Single extremity  supported Standing balance-Leahy Scale: Good Standing balance comment: mild instability noted occasionally. Pt demo 'dancing' without LOB noted                           ADL either performed or assessed with clinical judgement   ADL                                         General ADL Comments: reviewed safety and ADL compensatory techniques with pt and daughters; daughters verbalize understanding      Vision       Perception     Praxis      Cognition Arousal/Alertness: Awake/alert Behavior During Therapy: WFL for tasks assessed/performed Overall Cognitive Status: Impaired/Different from baseline Area of Impairment: Memory;Safety/judgement;Awareness;Problem solving                     Memory: Decreased recall of precautions;Decreased short-term memory   Safety/Judgement: Decreased awareness of deficits;Decreased awareness of safety Awareness: Emergent Problem Solving: Slow processing;Difficulty sequencing;Requires verbal cues General Comments: poor recall of precautions and sling         Exercises     Shoulder Instructions       General Comments HR 98 during activity    Pertinent Vitals/ Pain       Pain Assessment: Faces Faces Pain Scale: Hurts a little bit Pain Location: L UE Pain Descriptors / Indicators: Discomfort;Operative site guarding Pain Intervention(s): Monitored during session  Home Living Family/patient expects to be discharged to:: Private residence  Living Arrangements: Spouse/significant other Available Help at Discharge: Family;Available 24 hours/day Type of Home: House Home Access: Stairs to enter [employer] of Steps: 3 Entrance Stairs-Rails: Right Home Layout: Two level;Able to live on main level with bedroom/bathroom     Bathroom Shower/Tub: Occupational psychologist: Standard     Home Equipment: Kasandra Knudsen - single point   Additional Comments: daughter lives next door      Prior  Functioning/Environment Level of Independence: Independent        Comments: reports multiple falls w/o injury in past 96mo.   Frequency  Min 2X/week        Progress Toward Goals  OT Goals(current goals can now be found in the care plan section)  Progress towards OT goals: Progressing toward goals  Acute Rehab OT Goals Patient Stated Goal: to get home  OT Goal Formulation: With patient ADL Goals Pt Will Perform Grooming: with supervision;standing Pt Will Perform Lower Body Dressing: with supervision;sit to/from stand Pt Will Transfer to Toilet: with supervision;ambulating;bedside commode Pt Will Perform Toileting - Clothing Manipulation and hygiene: with supervision;sit to/from stand Pt/caregiver will Perform Home Exercise Program: With written HEP provided Additional ADL Goal #1: Pt/ caregiver will demonstrate use of sling with independence.  Plan Discharge plan remains appropriate;Frequency remains appropriate    Co-evaluation                 AM-PAC OT "6 Clicks" Daily Activity     Outcome Measure   Help from another person eating meals?: A Little Help from another person taking care of personal grooming?: A Little Help from another person toileting, which includes using toliet, bedpan, or urinal?: A Lot Help from another person bathing (including washing, rinsing, drying)?: A Lot Help from another person to put on and taking off regular upper body clothing?: A Lot Help from another person to put on and taking off regular lower body clothing?: A Lot 6 Click Score: 14    End of Session    OT Visit Diagnosis: Muscle weakness (generalized) (M62.81);Unsteadiness on feet (R26.81);Pain;History of falling (Z91.81) Pain - Right/Left: Left Pain - part of body: Shoulder;Arm   Activity Tolerance Patient tolerated treatment well   Patient Left in chair;with call bell/phone within reach;with chair alarm set   Nurse Communication Mobility status;Precautions         Time: 1441-1450 OT Time Calculation (min): 9 min  Charges: OT General Charges $OT Visit: 1 Visit OT Treatments $Self Care/Home Management : 8-22 mins  Jolaine Artist, [place] Pager [phone] Office [phone]    Delight Stare 06/13/2019, 3:38 PM

## 2019-06-13 NOTE — Evaluation (Signed)
Physical Therapy Evaluation Patient Details Name: Derrick Odonnell MRN: [id number] DOB: 1926/12/12 Today's Date: 06/13/2019   History of Present Illness  Derrick Odonnell is a 86 yo M w/ PMH of hypothyroidism, paroxysmal A.fib,  who presents to [facility] after a fall where he tripped inhis shop and fell on his L arm. Found to have L proximal humerus fx; s/p L proximal humerus IMN (06/13/19)    Clinical Impression  Patient presented sitting in recliner, awake, and willing to participate in therapy. Two daughters present and encouraging throughout session. PTA, pt was ambulating without assistance and was independent in all ADL's. Pt lives with his spouse in a multi-level home with 3 steps to enter- a daughter lives next door. At the time of evaluation, .  pt was overall min guard for safety with functional mobility tasks. He was able to ambulate ~500' with min guard for safety and w/o AD, trialed SPC, pt looked more confident and steady and advised to use cane in busy and/or challenging environments. Pt demo poor safety awareness and problem solving. He was unable to recall precautions, NWB status, or sling wear schedule. Pt and family educated on importance of these aforementioned safety precautions- verbalized understanding. He reports multiple falls w/o injury over the past 31mo. Recommend d/c home with family support and HH PT once medically appropriate. Pt would continue to benefit from skilled physical therapy services at this time while admitted and after d/c to address the below listed limitations in order to improve overall safety and independence with functional mobility.     Follow Up Recommendations Home health PT;Supervision/Assistance - 24 hour    Equipment Recommendations  Other (comment)(pt has cane at home)    Recommendations for Other Services       Precautions / Restrictions Precautions Precautions: Fall;Shoulder Type of Shoulder Precautions: no ROM at shoulder, NWB  Shoulder Interventions:  Shoulder sling/immobilizer;At all times Precaution Booklet Issued: Yes (comment) Precaution Comments: reviewed with patient and daughters Restrictions Weight Bearing Restrictions: Yes LUE Weight Bearing: Non weight bearing      Mobility  Bed Mobility               General bed mobility comments: OOB in recliner  Transfers Overall transfer level: Needs assistance Equipment used: None Transfers: Sit to/from Stand Sit to Stand: Min guard         General transfer comment: min guard for safety, no overt LOB noted, no physical assist required  Ambulation/Gait Ambulation/Gait assistance: Min guard Gait Distance (Feet): 500 Feet Assistive device: None;Straight cane Gait Pattern/deviations: Step-through pattern;Decreased stride length Gait velocity: normal   General Gait Details: Pt able to amb without AD but appears more confident and safe with SPC, though tends to hold it off of floor frequently. No overt LOB noted throughout- min guard for safety,  Stairs Stairs: Yes Stairs assistance: Min guard Stair Management: One rail Right;One rail Left;Alternating pattern;Forwards Number of Stairs: 2 General stair comments: min guard for safety and cues   Wheelchair Mobility    Modified Rankin (Stroke Patients Only)       Balance Overall balance assessment: Needs assistance Sitting-balance support: No upper extremity supported;Feet supported Sitting balance-Leahy Scale: Good     Standing balance support: No upper extremity supported;During functional activity;Single extremity supported Standing balance-Leahy Scale: Good Standing balance comment: mild instability noted occasionally. Pt demo 'dancing' without LOB noted  Pertinent Vitals/Pain Pain Assessment: Faces Faces Pain Scale: Hurts a little bit Pain Location: L UE Pain Descriptors / Indicators: Discomfort;Operative site guarding Pain Intervention(s): Monitored during  session    Home Living Family/patient expects to be discharged to:: Private residence Living Arrangements: Spouse/significant other Available Help at Discharge: Family;Available 24 hours/day Type of Home: House Home Access: Stairs to enter Entrance Stairs-Rails: Right Entrance Stairs-Number of Steps: 3 Home Layout: Two level;Able to live on main level with bedroom/bathroom Home Equipment: Kasandra Knudsen - single point Additional Comments: daughter lives next door    Prior Function Level of Independence: Independent         Comments: reports multiple falls w/o injury in past 57mo.     Hand Dominance   Dominant Hand: Right    Extremity/Trunk Assessment   Upper Extremity Assessment Upper Extremity Assessment: LUE deficits/detail;Defer to OT evaluation LUE Deficits / Details: s/p proximal humerus ORIF, in sling: WFL hand, wrist, forearm  LUE: Unable to fully assess due to immobilization    Lower Extremity Assessment Lower Extremity Assessment: Generalized weakness       Communication   Communication: HOH  Cognition Arousal/Alertness: Awake/alert Behavior During Therapy: WFL for tasks assessed/performed Overall Cognitive Status: Impaired/Different from baseline Area of Impairment: Memory;Safety/judgement;Awareness;Problem solving                     Memory: Decreased recall of precautions;Decreased short-term memory   Safety/Judgement: Decreased awareness of deficits;Decreased awareness of safety Awareness: Emergent Problem Solving: Slow processing;Difficulty sequencing;Requires verbal cues General Comments: poor recall of precautions and sling       General Comments General comments (skin integrity, edema, etc.): HR 98 during activity    Exercises     Assessment/Plan    PT Assessment Patient needs continued PT services  PT Problem List Decreased strength;Decreased range of motion;Decreased activity tolerance;Decreased balance;Decreased mobility;Decreased  coordination;Decreased cognition;Decreased knowledge of use of DME;Decreased safety awareness;Decreased knowledge of precautions       PT Treatment Interventions DME instruction;Gait training;Stair training;Functional mobility training;Therapeutic activities;Therapeutic exercise;Balance training;Neuromuscular re-education;Cognitive remediation;Patient/family education    PT Goals (Current goals can be found in the Care Plan section)  Acute Rehab PT Goals Patient Stated Goal: to get home  PT Goal Formulation: With patient/family Time For Goal Achievement: 06/27/19 Potential to Achieve Goals: Good    Frequency Min 3X/week   Barriers to discharge        Co-evaluation               AM-PAC PT "6 Clicks" Mobility  Outcome Measure Help needed turning from your back to your side while in a flat bed without using bedrails?: A Little Help needed moving from lying on your back to sitting on the side of a flat bed without using bedrails?: A Little Help needed moving to and from a bed to a chair (including a wheelchair)?: None Help needed standing up from a chair using your arms (e.g., wheelchair or bedside chair)?: None Help needed to walk in hospital room?: None Help needed climbing 3-5 steps with a railing? : A Little 6 Click Score: 21    End of Session Equipment Utilized During Treatment: Gait belt Activity Tolerance: Patient tolerated treatment well Patient left: in chair;with call bell/phone within reach;with family/visitor present   PT Visit Diagnosis: Muscle weakness (generalized) (M62.81);Repeated falls (R29.6);Other abnormalities of gait and mobility (R26.89)    Time: 1345-1405 PT Time Calculation (min) (ACUTE ONLY): 20 min   Charges:   PT Evaluation $PT Eval Moderate Complexity:  Farber, SPT Acute Rehab  [id number]   Dara Hoyer 06/13/2019, 4:01 PM

## 2019-06-13 NOTE — Discharge Summary (Signed)
Name: Derrick Odonnell MRN: [id number] DOB: 06-28-1926 86 y.o. PCP: Lowella Dandy, NP  Date of Admission: 06/11/2019  2:58 PM Date of Discharge:  06/13/2019 Attending Physician: Sid Falcon, MD  Discharge Diagnosis: 1. Closed Left Arm Fracture  Discharge Medications: Allergies as of 06/13/2019   No Known Allergies      Medication List     TAKE these medications    acetaminophen 500 MG tablet Commonly known as: TYLENOL Take 2 tablets (1,000 mg total) by mouth every 8 (eight) hours for 14 days.   levothyroxine 88 MCG tablet Commonly known as: SYNTHROID Take 88 mcg by mouth daily before breakfast.   ondansetron 4 MG tablet Commonly known as: Zofran Take 1 tablet (4 mg total) by mouth every 8 (eight) hours as needed for up to 7 days for nausea or vomiting.   oxyCODONE 5 MG immediate release tablet Commonly known as: Oxy IR/ROXICODONE Take 1-2 pills every 6 hrs as needed for pain, no more than 6 per day   PREVAGEN EXTRA STRENGTH PO Take 2 capsules by mouth daily with breakfast.               Durable Medical Equipment  (From admission, onward)           Start     Ordered   06/13/19 1431  DME 3-in-1  Once     06/13/19 1431            Disposition and follow-up:   Derrick Odonnell was discharged from [facility] in Stable condition.  At the hospital follow up visit please address:  1.    Closed, proximal left humerus fracture - Wound care starting 06/15/2019, HH PT/OT - Follow up with Dr.Varkey in 1 week          2.  Labs / imaging needed at time of follow-up: na  3.  Pending labs/ test needing follow-up: na  Follow-up Appointments: Follow-up Information     Hiram Gash, MD In 1 week.   Specialty: Orthopedic Surgery Why: For wound re-check Contact information: [address] [place] [address] [place] [facility] Course by problem list: 1.   Closed left proximal humerus fracture s/p  ORIF on 4/23 No as needed pain medications used overnight. Pain controlled with tylenol Will discharge with follow up with Dr.Varkey.   Hypothyroidism On 88 MCG levothyroxine at home.  TSH in normal limits Continued with levothyroxine 88 MCG  History of paroxysmal A. Fib Tachycardia History of irregular heart rate in the past, but reports this problem is resolved. Sinus tachycardia on ECG night after surgery. Patient given IV fluids and tachycardia improved. Patient was asymptomatic during this time.   Discharge Vitals:   BP (!) 95/47 (BP Location: Right Arm)   Pulse 78   Temp 98.2 F (36.8 C) (Oral)   Resp 18   Ht 5\' 6"  (1.676 m)   Wt 62.6 kg   SpO2 99%   BMI 22.27 kg/m   Pertinent Labs, Studies, and Procedures:  CBC Latest Ref Rng & Units 06/13/2019 06/11/2019 11/29/2013  WBC 4.0 - 10.5 K/uL 8.5 9.6 9.4  Hemoglobin 13.0 - 17.0 g/dL 11.4(L) 12.6(L) 11.6(L)  Hematocrit 39.0 - 52.0 % 35.1(L) 38.9(L) 34.3(L)  Platelets 150 - 400 K/uL 186 225 214   BMP Latest Ref Rng & Units 06/13/2019 06/11/2019 11/29/2013  Glucose 70 - 99 mg/dL 101(H) 92 148(H)  BUN  8 - 23 mg/dL 16 17 23   Creatinine 0.61 - 1.24 mg/dL 1.09 1.09 1.65(H)  Sodium 135 - 145 mmol/L 135 137 138  Potassium 3.5 - 5.1 mmol/L 3.8 4.6 4.3  Chloride 98 - 111 mmol/L 105 100 103  CO2 22 - 32 mmol/L 22 23 18(L)  Calcium 8.9 - 10.3 mg/dL 7.7(L) 8.9 8.8   EXAM: LEFT HUMERUS - 2+ VIEW  COMPARISON:  None.  FINDINGS: The patient is status post left humeral ORIF with an intramedullary rod crossing the fracture and both proximal and distal interlocking screws.  IMPRESSION: Left humeral ORIF as above.  Discharge Instructions: Discharge Instructions     Diet - low sodium heart healthy   Complete by: As directed    Increase activity slowly   Complete by: As directed        Signed:  Tamsen Snider, MD PGY1

## 2019-06-13 NOTE — Plan of Care (Signed)

## 2019-06-14 NOTE — Anesthesia Postprocedure Evaluation (Signed)
Anesthesia Post Note  Patient: Derrick Odonnell  Procedure(s) Performed: INTRAMEDULLARY (IM) NAIL HUMERAL (Left Arm Upper)     Patient location during evaluation: PACU Anesthesia Type: Regional and General Level of consciousness: awake Pain management: pain level controlled Vital Signs Assessment: post-procedure vital signs reviewed and stable Respiratory status: spontaneous breathing, nonlabored ventilation, respiratory function stable and patient connected to nasal cannula oxygen Cardiovascular status: blood pressure returned to baseline and stable Postop Assessment: no apparent nausea or vomiting Anesthetic complications: no    Last Vitals:  Vitals:   06/13/19 1242 06/13/19 1336  BP: (!) 109/51 (!) 95/47  Pulse: 75 78  Resp: 17 18  Temp: (!) 36.4 C 36.8 C  SpO2: 99% 99%    Last Pain:  Vitals:   06/13/19 1608  TempSrc:   PainSc: 0-No pain                 Charmian Forbis P [name]

## 2019-06-15 ENCOUNTER — Encounter: Payer: Self-pay | Admitting: *Deleted

## 2019-06-16 DIAGNOSIS — S42295D Other nondisplaced fracture of upper end of left humerus, subsequent encounter for fracture with routine healing: Secondary | ICD-10-CM | POA: Diagnosis not present

## 2019-06-19 DIAGNOSIS — W19XXXD Unspecified fall, subsequent encounter: Secondary | ICD-10-CM | POA: Diagnosis not present

## 2019-06-19 DIAGNOSIS — I480 Paroxysmal atrial fibrillation: Secondary | ICD-10-CM | POA: Diagnosis not present

## 2019-06-19 DIAGNOSIS — S5292XA Unspecified fracture of left forearm, initial encounter for closed fracture: Secondary | ICD-10-CM | POA: Diagnosis not present

## 2019-06-19 DIAGNOSIS — S42202D Unspecified fracture of upper end of left humerus, subsequent encounter for fracture with routine healing: Secondary | ICD-10-CM | POA: Diagnosis not present

## 2019-06-19 DIAGNOSIS — Z9181 History of falling: Secondary | ICD-10-CM | POA: Diagnosis not present

## 2019-06-19 DIAGNOSIS — E039 Hypothyroidism, unspecified: Secondary | ICD-10-CM | POA: Diagnosis not present

## 2019-06-25 DIAGNOSIS — E039 Hypothyroidism, unspecified: Secondary | ICD-10-CM | POA: Diagnosis not present

## 2019-06-25 DIAGNOSIS — I480 Paroxysmal atrial fibrillation: Secondary | ICD-10-CM | POA: Diagnosis not present

## 2019-06-25 DIAGNOSIS — S42202D Unspecified fracture of upper end of left humerus, subsequent encounter for fracture with routine healing: Secondary | ICD-10-CM | POA: Diagnosis not present

## 2019-06-25 DIAGNOSIS — W19XXXD Unspecified fall, subsequent encounter: Secondary | ICD-10-CM | POA: Diagnosis not present

## 2019-06-25 DIAGNOSIS — Z9181 History of falling: Secondary | ICD-10-CM | POA: Diagnosis not present

## 2019-07-02 DIAGNOSIS — C61 Malignant neoplasm of prostate: Secondary | ICD-10-CM | POA: Diagnosis not present

## 2019-07-02 DIAGNOSIS — E039 Hypothyroidism, unspecified: Secondary | ICD-10-CM | POA: Diagnosis not present

## 2019-07-02 DIAGNOSIS — S42202D Unspecified fracture of upper end of left humerus, subsequent encounter for fracture with routine healing: Secondary | ICD-10-CM | POA: Diagnosis not present

## 2019-07-02 DIAGNOSIS — Z9181 History of falling: Secondary | ICD-10-CM | POA: Diagnosis not present

## 2019-07-02 DIAGNOSIS — W19XXXD Unspecified fall, subsequent encounter: Secondary | ICD-10-CM | POA: Diagnosis not present

## 2019-07-02 DIAGNOSIS — I480 Paroxysmal atrial fibrillation: Secondary | ICD-10-CM | POA: Diagnosis not present

## 2019-07-09 DIAGNOSIS — S42295D Other nondisplaced fracture of upper end of left humerus, subsequent encounter for fracture with routine healing: Secondary | ICD-10-CM | POA: Diagnosis not present

## 2019-07-09 DIAGNOSIS — C61 Malignant neoplasm of prostate: Secondary | ICD-10-CM | POA: Diagnosis not present

## 2019-07-10 DIAGNOSIS — Z9181 History of falling: Secondary | ICD-10-CM | POA: Diagnosis not present

## 2019-07-10 DIAGNOSIS — S42202D Unspecified fracture of upper end of left humerus, subsequent encounter for fracture with routine healing: Secondary | ICD-10-CM | POA: Diagnosis not present

## 2019-07-10 DIAGNOSIS — E039 Hypothyroidism, unspecified: Secondary | ICD-10-CM | POA: Diagnosis not present

## 2019-07-10 DIAGNOSIS — W19XXXD Unspecified fall, subsequent encounter: Secondary | ICD-10-CM | POA: Diagnosis not present

## 2019-07-10 DIAGNOSIS — I480 Paroxysmal atrial fibrillation: Secondary | ICD-10-CM | POA: Diagnosis not present

## 2019-07-15 DIAGNOSIS — W19XXXD Unspecified fall, subsequent encounter: Secondary | ICD-10-CM | POA: Diagnosis not present

## 2019-07-15 DIAGNOSIS — E039 Hypothyroidism, unspecified: Secondary | ICD-10-CM | POA: Diagnosis not present

## 2019-07-15 DIAGNOSIS — I480 Paroxysmal atrial fibrillation: Secondary | ICD-10-CM | POA: Diagnosis not present

## 2019-07-15 DIAGNOSIS — S42202D Unspecified fracture of upper end of left humerus, subsequent encounter for fracture with routine healing: Secondary | ICD-10-CM | POA: Diagnosis not present

## 2019-07-15 DIAGNOSIS — Z9181 History of falling: Secondary | ICD-10-CM | POA: Diagnosis not present

## 2019-07-17 DIAGNOSIS — E039 Hypothyroidism, unspecified: Secondary | ICD-10-CM | POA: Diagnosis not present

## 2019-07-17 DIAGNOSIS — I480 Paroxysmal atrial fibrillation: Secondary | ICD-10-CM | POA: Diagnosis not present

## 2019-07-17 DIAGNOSIS — W19XXXD Unspecified fall, subsequent encounter: Secondary | ICD-10-CM | POA: Diagnosis not present

## 2019-07-17 DIAGNOSIS — S42202D Unspecified fracture of upper end of left humerus, subsequent encounter for fracture with routine healing: Secondary | ICD-10-CM | POA: Diagnosis not present

## 2019-07-17 DIAGNOSIS — Z9181 History of falling: Secondary | ICD-10-CM | POA: Diagnosis not present

## 2019-07-19 DIAGNOSIS — S42202D Unspecified fracture of upper end of left humerus, subsequent encounter for fracture with routine healing: Secondary | ICD-10-CM | POA: Diagnosis not present

## 2019-07-19 DIAGNOSIS — W19XXXD Unspecified fall, subsequent encounter: Secondary | ICD-10-CM | POA: Diagnosis not present

## 2019-07-19 DIAGNOSIS — E039 Hypothyroidism, unspecified: Secondary | ICD-10-CM | POA: Diagnosis not present

## 2019-07-19 DIAGNOSIS — I480 Paroxysmal atrial fibrillation: Secondary | ICD-10-CM | POA: Diagnosis not present

## 2019-07-19 DIAGNOSIS — Z9181 History of falling: Secondary | ICD-10-CM | POA: Diagnosis not present

## 2019-07-23 DIAGNOSIS — I480 Paroxysmal atrial fibrillation: Secondary | ICD-10-CM | POA: Diagnosis not present

## 2019-07-23 DIAGNOSIS — W19XXXD Unspecified fall, subsequent encounter: Secondary | ICD-10-CM | POA: Diagnosis not present

## 2019-07-23 DIAGNOSIS — E039 Hypothyroidism, unspecified: Secondary | ICD-10-CM | POA: Diagnosis not present

## 2019-07-23 DIAGNOSIS — Z9181 History of falling: Secondary | ICD-10-CM | POA: Diagnosis not present

## 2019-07-23 DIAGNOSIS — S42202D Unspecified fracture of upper end of left humerus, subsequent encounter for fracture with routine healing: Secondary | ICD-10-CM | POA: Diagnosis not present

## 2019-07-24 DIAGNOSIS — Z9181 History of falling: Secondary | ICD-10-CM | POA: Diagnosis not present

## 2019-07-24 DIAGNOSIS — W19XXXD Unspecified fall, subsequent encounter: Secondary | ICD-10-CM | POA: Diagnosis not present

## 2019-07-24 DIAGNOSIS — S42202D Unspecified fracture of upper end of left humerus, subsequent encounter for fracture with routine healing: Secondary | ICD-10-CM | POA: Diagnosis not present

## 2019-07-24 DIAGNOSIS — I480 Paroxysmal atrial fibrillation: Secondary | ICD-10-CM | POA: Diagnosis not present

## 2019-07-24 DIAGNOSIS — E039 Hypothyroidism, unspecified: Secondary | ICD-10-CM | POA: Diagnosis not present

## 2019-07-29 DIAGNOSIS — Z9181 History of falling: Secondary | ICD-10-CM | POA: Diagnosis not present

## 2019-07-29 DIAGNOSIS — W19XXXD Unspecified fall, subsequent encounter: Secondary | ICD-10-CM | POA: Diagnosis not present

## 2019-07-29 DIAGNOSIS — I480 Paroxysmal atrial fibrillation: Secondary | ICD-10-CM | POA: Diagnosis not present

## 2019-07-29 DIAGNOSIS — E039 Hypothyroidism, unspecified: Secondary | ICD-10-CM | POA: Diagnosis not present

## 2019-07-29 DIAGNOSIS — S42202D Unspecified fracture of upper end of left humerus, subsequent encounter for fracture with routine healing: Secondary | ICD-10-CM | POA: Diagnosis not present

## 2019-08-03 DIAGNOSIS — Z9181 History of falling: Secondary | ICD-10-CM | POA: Diagnosis not present

## 2019-08-03 DIAGNOSIS — W19XXXD Unspecified fall, subsequent encounter: Secondary | ICD-10-CM | POA: Diagnosis not present

## 2019-08-03 DIAGNOSIS — E039 Hypothyroidism, unspecified: Secondary | ICD-10-CM | POA: Diagnosis not present

## 2019-08-03 DIAGNOSIS — S42202D Unspecified fracture of upper end of left humerus, subsequent encounter for fracture with routine healing: Secondary | ICD-10-CM | POA: Diagnosis not present

## 2019-08-03 DIAGNOSIS — I480 Paroxysmal atrial fibrillation: Secondary | ICD-10-CM | POA: Diagnosis not present

## 2019-08-13 DIAGNOSIS — S42295D Other nondisplaced fracture of upper end of left humerus, subsequent encounter for fracture with routine healing: Secondary | ICD-10-CM | POA: Diagnosis not present

## 2019-08-14 DIAGNOSIS — E785 Hyperlipidemia, unspecified: Secondary | ICD-10-CM | POA: Diagnosis not present

## 2019-08-14 DIAGNOSIS — I10 Essential (primary) hypertension: Secondary | ICD-10-CM | POA: Diagnosis not present

## 2019-08-14 DIAGNOSIS — R634 Abnormal weight loss: Secondary | ICD-10-CM | POA: Diagnosis not present

## 2019-08-14 DIAGNOSIS — E538 Deficiency of other specified B group vitamins: Secondary | ICD-10-CM | POA: Diagnosis not present

## 2019-08-14 DIAGNOSIS — E039 Hypothyroidism, unspecified: Secondary | ICD-10-CM | POA: Diagnosis not present

## 2019-08-14 DIAGNOSIS — I480 Paroxysmal atrial fibrillation: Secondary | ICD-10-CM | POA: Diagnosis not present

## 2019-08-14 DIAGNOSIS — C61 Malignant neoplasm of prostate: Secondary | ICD-10-CM | POA: Diagnosis not present

## 2019-08-17 DIAGNOSIS — E039 Hypothyroidism, unspecified: Secondary | ICD-10-CM | POA: Diagnosis not present

## 2019-08-17 DIAGNOSIS — W19XXXD Unspecified fall, subsequent encounter: Secondary | ICD-10-CM | POA: Diagnosis not present

## 2019-08-17 DIAGNOSIS — Z9181 History of falling: Secondary | ICD-10-CM | POA: Diagnosis not present

## 2019-08-17 DIAGNOSIS — I480 Paroxysmal atrial fibrillation: Secondary | ICD-10-CM | POA: Diagnosis not present

## 2019-08-17 DIAGNOSIS — S42202D Unspecified fracture of upper end of left humerus, subsequent encounter for fracture with routine healing: Secondary | ICD-10-CM | POA: Diagnosis not present

## 2019-10-01 DIAGNOSIS — R634 Abnormal weight loss: Secondary | ICD-10-CM | POA: Diagnosis not present

## 2019-10-01 DIAGNOSIS — C61 Malignant neoplasm of prostate: Secondary | ICD-10-CM | POA: Diagnosis not present

## 2019-10-01 DIAGNOSIS — E538 Deficiency of other specified B group vitamins: Secondary | ICD-10-CM | POA: Diagnosis not present

## 2019-12-17 DIAGNOSIS — Z23 Encounter for immunization: Secondary | ICD-10-CM | POA: Diagnosis not present

## 2019-12-17 DIAGNOSIS — R634 Abnormal weight loss: Secondary | ICD-10-CM | POA: Diagnosis not present

## 2019-12-17 DIAGNOSIS — C61 Malignant neoplasm of prostate: Secondary | ICD-10-CM | POA: Diagnosis not present

## 2019-12-17 DIAGNOSIS — E039 Hypothyroidism, unspecified: Secondary | ICD-10-CM | POA: Diagnosis not present

## 2019-12-17 DIAGNOSIS — I10 Essential (primary) hypertension: Secondary | ICD-10-CM | POA: Diagnosis not present

## 2019-12-30 DIAGNOSIS — H3530 Unspecified macular degeneration: Secondary | ICD-10-CM | POA: Diagnosis not present

## 2019-12-30 DIAGNOSIS — H2703 Aphakia, bilateral: Secondary | ICD-10-CM | POA: Diagnosis not present

## 2020-01-05 DIAGNOSIS — R339 Retention of urine, unspecified: Secondary | ICD-10-CM | POA: Diagnosis not present

## 2020-01-05 DIAGNOSIS — C61 Malignant neoplasm of prostate: Secondary | ICD-10-CM | POA: Diagnosis not present

## 2020-05-06 DIAGNOSIS — Z1331 Encounter for screening for depression: Secondary | ICD-10-CM | POA: Diagnosis not present

## 2020-05-06 DIAGNOSIS — E039 Hypothyroidism, unspecified: Secondary | ICD-10-CM | POA: Diagnosis not present

## 2020-05-06 DIAGNOSIS — R634 Abnormal weight loss: Secondary | ICD-10-CM | POA: Diagnosis not present

## 2020-05-06 DIAGNOSIS — C61 Malignant neoplasm of prostate: Secondary | ICD-10-CM | POA: Diagnosis not present

## 2020-05-06 DIAGNOSIS — I10 Essential (primary) hypertension: Secondary | ICD-10-CM | POA: Diagnosis not present

## 2020-05-06 DIAGNOSIS — Z23 Encounter for immunization: Secondary | ICD-10-CM | POA: Diagnosis not present

## 2020-05-06 DIAGNOSIS — I480 Paroxysmal atrial fibrillation: Secondary | ICD-10-CM | POA: Diagnosis not present

## 2020-08-03 DIAGNOSIS — Z1331 Encounter for screening for depression: Secondary | ICD-10-CM | POA: Diagnosis not present

## 2020-08-03 DIAGNOSIS — Z9181 History of falling: Secondary | ICD-10-CM | POA: Diagnosis not present

## 2020-08-03 DIAGNOSIS — Z139 Encounter for screening, unspecified: Secondary | ICD-10-CM | POA: Diagnosis not present

## 2020-08-03 DIAGNOSIS — E785 Hyperlipidemia, unspecified: Secondary | ICD-10-CM | POA: Diagnosis not present

## 2020-08-03 DIAGNOSIS — Z0000 Encounter for general adult medical examination without abnormal findings: Secondary | ICD-10-CM | POA: Diagnosis not present

## 2020-08-08 DIAGNOSIS — I10 Essential (primary) hypertension: Secondary | ICD-10-CM | POA: Diagnosis not present

## 2020-08-08 DIAGNOSIS — R634 Abnormal weight loss: Secondary | ICD-10-CM | POA: Diagnosis not present

## 2020-08-08 DIAGNOSIS — C61 Malignant neoplasm of prostate: Secondary | ICD-10-CM | POA: Diagnosis not present

## 2020-08-08 DIAGNOSIS — I480 Paroxysmal atrial fibrillation: Secondary | ICD-10-CM | POA: Diagnosis not present

## 2020-08-08 DIAGNOSIS — Z6820 Body mass index (BMI) 20.0-20.9, adult: Secondary | ICD-10-CM | POA: Diagnosis not present

## 2020-08-08 DIAGNOSIS — E039 Hypothyroidism, unspecified: Secondary | ICD-10-CM | POA: Diagnosis not present

## 2020-08-18 DIAGNOSIS — C61 Malignant neoplasm of prostate: Secondary | ICD-10-CM | POA: Diagnosis not present

## 2020-08-18 DIAGNOSIS — R808 Other proteinuria: Secondary | ICD-10-CM | POA: Diagnosis not present

## 2020-11-28 DIAGNOSIS — I480 Paroxysmal atrial fibrillation: Secondary | ICD-10-CM | POA: Diagnosis not present

## 2020-11-28 DIAGNOSIS — Z681 Body mass index (BMI) 19 or less, adult: Secondary | ICD-10-CM | POA: Diagnosis not present

## 2020-11-28 DIAGNOSIS — R634 Abnormal weight loss: Secondary | ICD-10-CM | POA: Diagnosis not present

## 2020-11-28 DIAGNOSIS — C61 Malignant neoplasm of prostate: Secondary | ICD-10-CM | POA: Diagnosis not present

## 2020-11-28 DIAGNOSIS — I10 Essential (primary) hypertension: Secondary | ICD-10-CM | POA: Diagnosis not present

## 2020-11-28 DIAGNOSIS — Z23 Encounter for immunization: Secondary | ICD-10-CM | POA: Diagnosis not present

## 2020-11-28 DIAGNOSIS — E039 Hypothyroidism, unspecified: Secondary | ICD-10-CM | POA: Diagnosis not present

## 2020-12-05 DIAGNOSIS — C61 Malignant neoplasm of prostate: Secondary | ICD-10-CM | POA: Diagnosis not present

## 2021-01-24 DIAGNOSIS — R42 Dizziness and giddiness: Secondary | ICD-10-CM | POA: Diagnosis not present

## 2021-01-24 DIAGNOSIS — R5381 Other malaise: Secondary | ICD-10-CM | POA: Diagnosis not present

## 2021-01-24 DIAGNOSIS — Z681 Body mass index (BMI) 19 or less, adult: Secondary | ICD-10-CM | POA: Diagnosis not present

## 2021-01-24 DIAGNOSIS — R5383 Other fatigue: Secondary | ICD-10-CM | POA: Diagnosis not present

## 2021-01-24 DIAGNOSIS — R0789 Other chest pain: Secondary | ICD-10-CM | POA: Diagnosis not present

## 2021-01-24 DIAGNOSIS — S9031XA Contusion of right foot, initial encounter: Secondary | ICD-10-CM | POA: Diagnosis not present

## 2021-01-24 DIAGNOSIS — F4321 Adjustment disorder with depressed mood: Secondary | ICD-10-CM | POA: Diagnosis not present

## 2021-02-23 DIAGNOSIS — C61 Malignant neoplasm of prostate: Secondary | ICD-10-CM | POA: Diagnosis not present

## 2021-02-27 DIAGNOSIS — C61 Malignant neoplasm of prostate: Secondary | ICD-10-CM | POA: Diagnosis not present

## 2021-02-27 DIAGNOSIS — R339 Retention of urine, unspecified: Secondary | ICD-10-CM | POA: Diagnosis not present

## 2021-03-06 DIAGNOSIS — E039 Hypothyroidism, unspecified: Secondary | ICD-10-CM | POA: Diagnosis not present

## 2021-03-06 DIAGNOSIS — R634 Abnormal weight loss: Secondary | ICD-10-CM | POA: Diagnosis not present

## 2021-03-06 DIAGNOSIS — C61 Malignant neoplasm of prostate: Secondary | ICD-10-CM | POA: Diagnosis not present

## 2021-03-06 DIAGNOSIS — I10 Essential (primary) hypertension: Secondary | ICD-10-CM | POA: Diagnosis not present

## 2021-03-06 DIAGNOSIS — Z681 Body mass index (BMI) 19 or less, adult: Secondary | ICD-10-CM | POA: Diagnosis not present

## 2021-03-06 DIAGNOSIS — K219 Gastro-esophageal reflux disease without esophagitis: Secondary | ICD-10-CM | POA: Diagnosis not present

## 2021-03-06 DIAGNOSIS — F4321 Adjustment disorder with depressed mood: Secondary | ICD-10-CM | POA: Diagnosis not present

## 2021-03-06 DIAGNOSIS — I480 Paroxysmal atrial fibrillation: Secondary | ICD-10-CM | POA: Diagnosis not present

## 2021-04-18 DIAGNOSIS — K222 Esophageal obstruction: Secondary | ICD-10-CM | POA: Diagnosis not present

## 2021-04-18 DIAGNOSIS — R1310 Dysphagia, unspecified: Secondary | ICD-10-CM | POA: Diagnosis not present

## 2021-04-18 DIAGNOSIS — K224 Dyskinesia of esophagus: Secondary | ICD-10-CM | POA: Diagnosis not present

## 2021-04-18 DIAGNOSIS — J342 Deviated nasal septum: Secondary | ICD-10-CM | POA: Diagnosis not present

## 2021-04-18 DIAGNOSIS — K225 Diverticulum of esophagus, acquired: Secondary | ICD-10-CM | POA: Diagnosis not present

## 2021-04-28 DIAGNOSIS — K225 Diverticulum of esophagus, acquired: Secondary | ICD-10-CM | POA: Diagnosis not present

## 2021-04-28 DIAGNOSIS — R1310 Dysphagia, unspecified: Secondary | ICD-10-CM | POA: Diagnosis not present

## 2021-04-28 DIAGNOSIS — K5710 Diverticulosis of small intestine without perforation or abscess without bleeding: Secondary | ICD-10-CM | POA: Diagnosis not present

## 2021-06-05 DIAGNOSIS — C61 Malignant neoplasm of prostate: Secondary | ICD-10-CM | POA: Diagnosis not present

## 2021-06-05 DIAGNOSIS — I480 Paroxysmal atrial fibrillation: Secondary | ICD-10-CM | POA: Diagnosis not present

## 2021-06-05 DIAGNOSIS — K219 Gastro-esophageal reflux disease without esophagitis: Secondary | ICD-10-CM | POA: Diagnosis not present

## 2021-06-05 DIAGNOSIS — F4321 Adjustment disorder with depressed mood: Secondary | ICD-10-CM | POA: Diagnosis not present

## 2021-06-05 DIAGNOSIS — R634 Abnormal weight loss: Secondary | ICD-10-CM | POA: Diagnosis not present

## 2021-06-05 DIAGNOSIS — E039 Hypothyroidism, unspecified: Secondary | ICD-10-CM | POA: Diagnosis not present

## 2021-06-05 DIAGNOSIS — I10 Essential (primary) hypertension: Secondary | ICD-10-CM | POA: Diagnosis not present

## 2021-06-05 DIAGNOSIS — K225 Diverticulum of esophagus, acquired: Secondary | ICD-10-CM | POA: Diagnosis not present

## 2021-06-08 IMAGING — CT CT SHOULDER*L* W/O CM
2 series · 15 of 29 positions shown, 19 images · non-contrast
Comparison: 06/11/2019

CLINICAL DATA: Left shoulder pain, fracture

EXAM:
CT OF THE UPPER LEFT EXTREMITY WITHOUT CONTRAST
TECHNIQUE: Multidetector CT imaging of the upper left extremity was performed
according to the standard protocol.

[Series 3: shoulder lt 3.0 b31s st · axial · 0.49mm/px · z∈[+1438,+1604]mm · 10 of 65 slices shown, 13 images]
[im 5/65  soft-tissue]
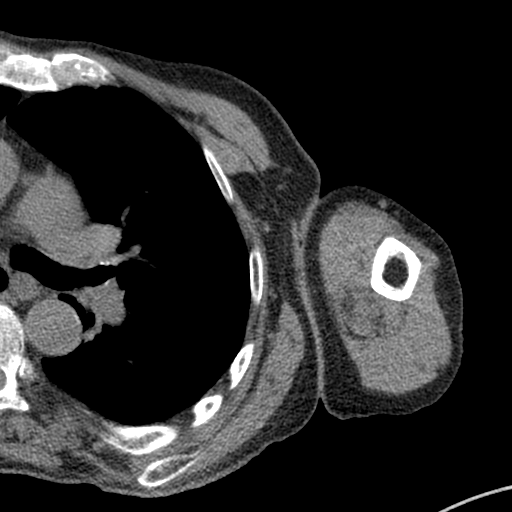
[im 5/65  bone]
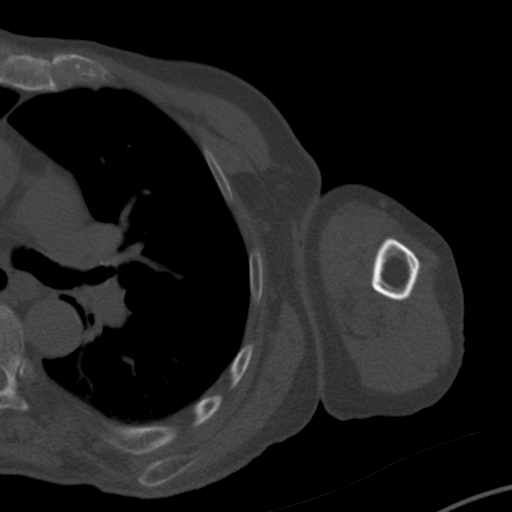
[im 10/65  bone]
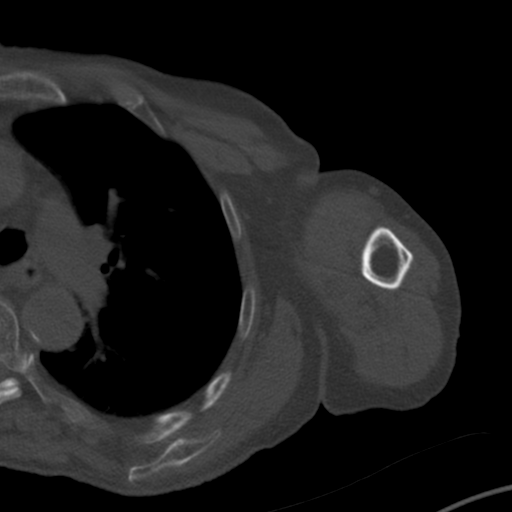
[im 20/65  bone]
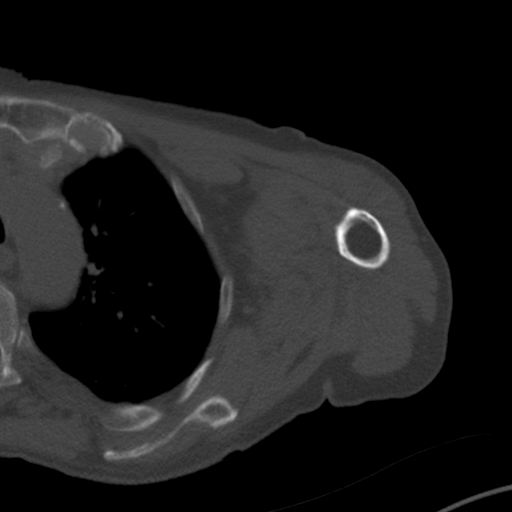
[im 25/65  bone]
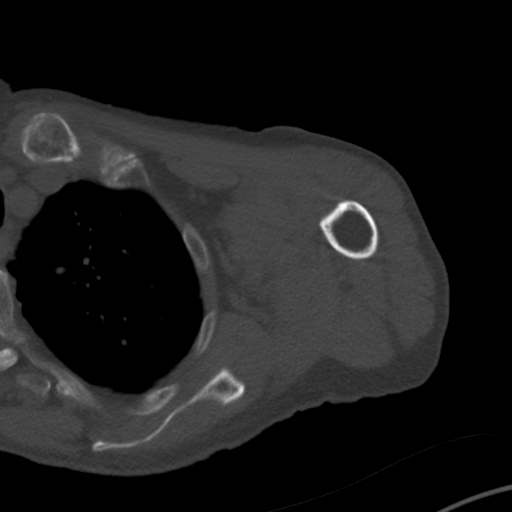
[im 30/65  soft-tissue]
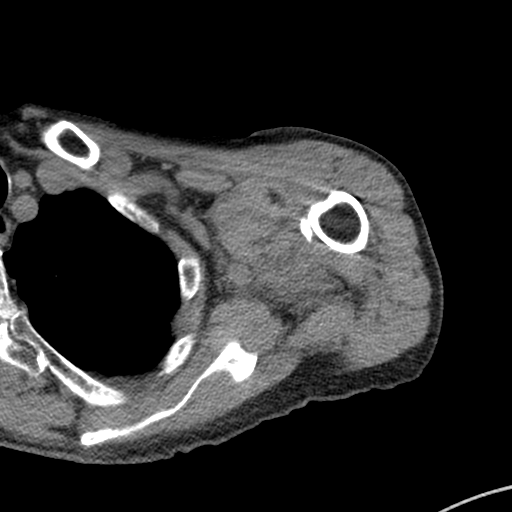
[im 30/65  bone]
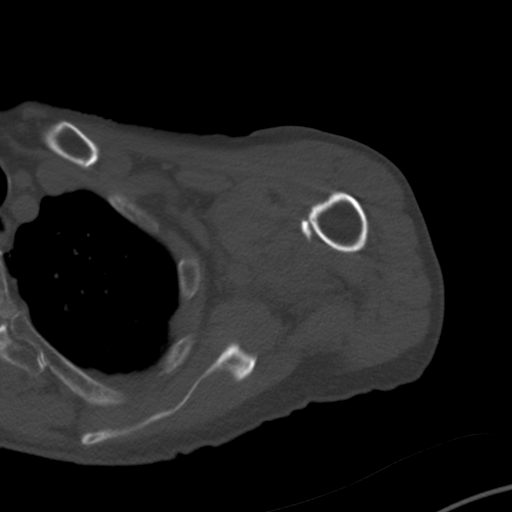
[im 35/65  bone]
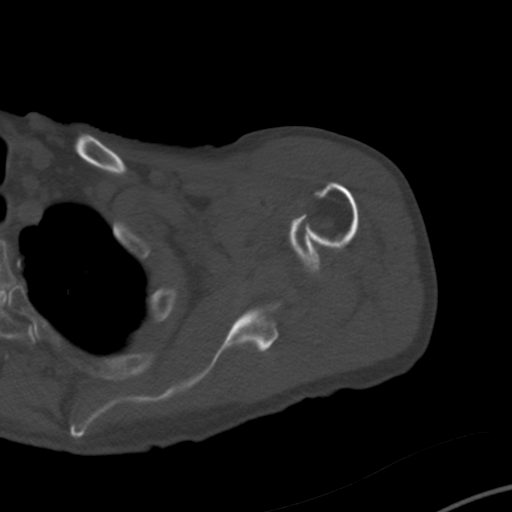
[im 40/65  bone]
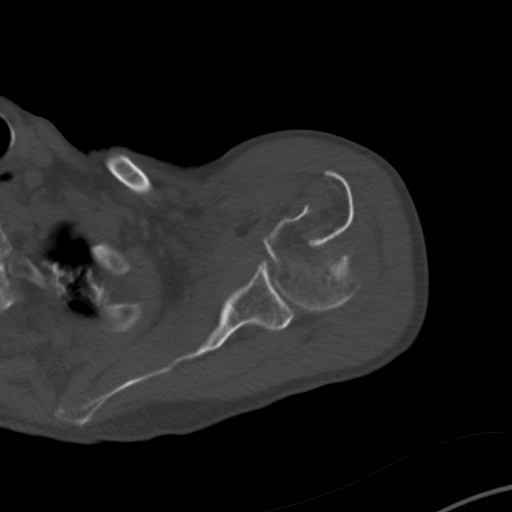
[im 50/65  bone]
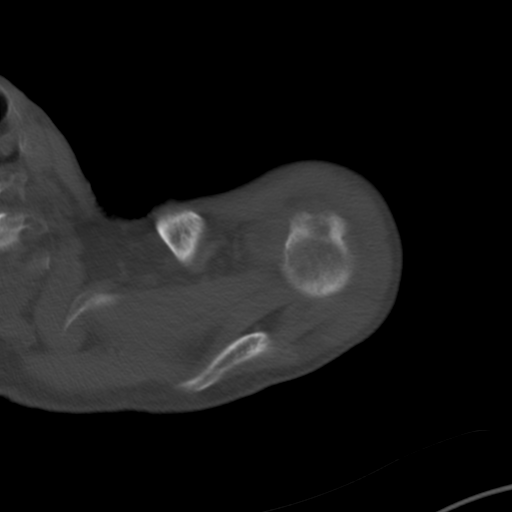
[im 55/65  soft-tissue]
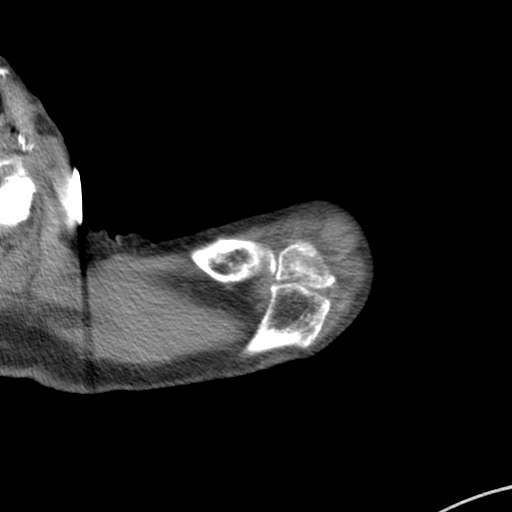
[im 55/65  bone]
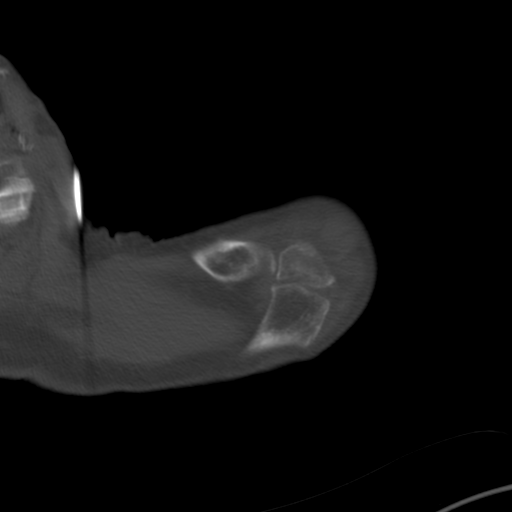
[im 60/65  bone]
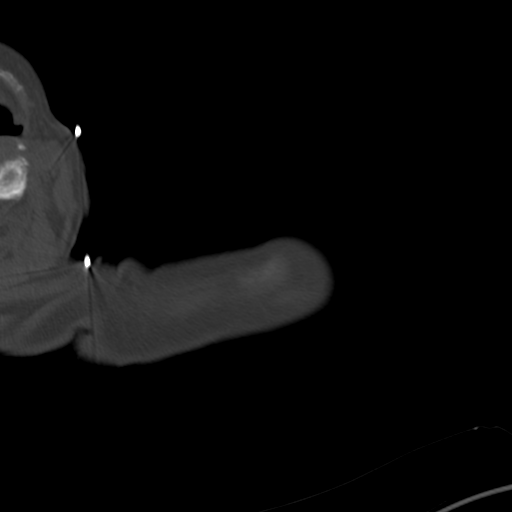

[Series 10: sag st · sagittal · 0.45mm/px · 5 of 114 slices shown, 6 images]
[im 38/114  bone]
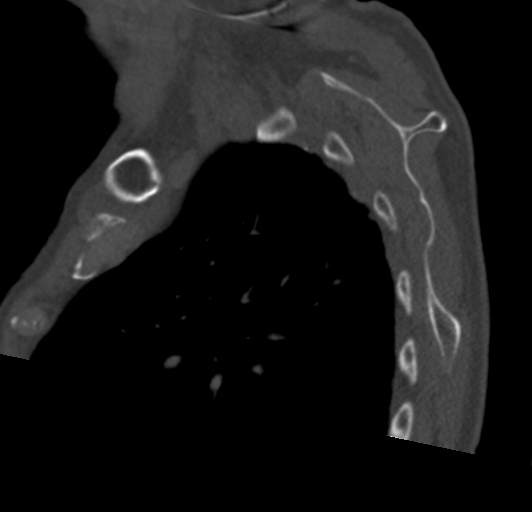
[im 48/114  bone]
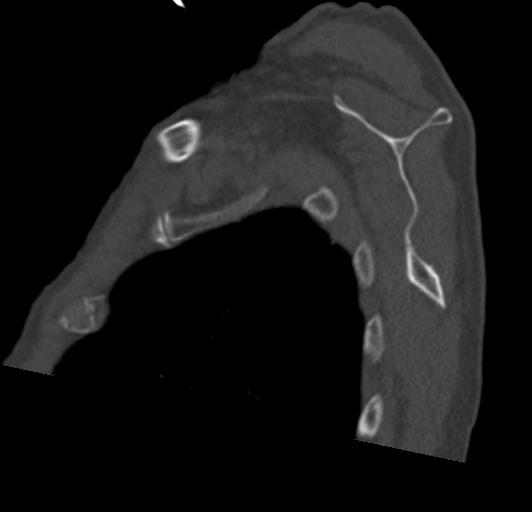
[im 57/114  soft-tissue]
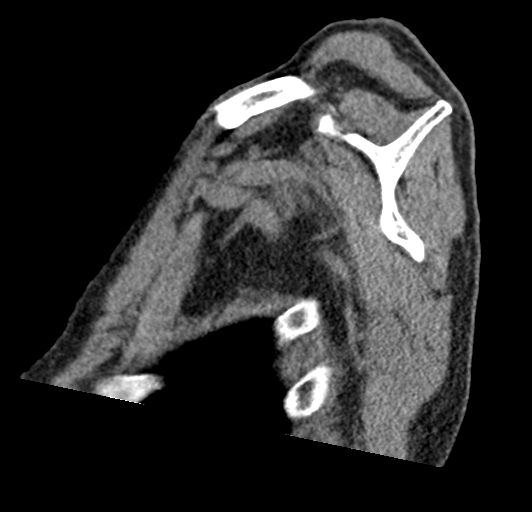
[im 57/114  bone]
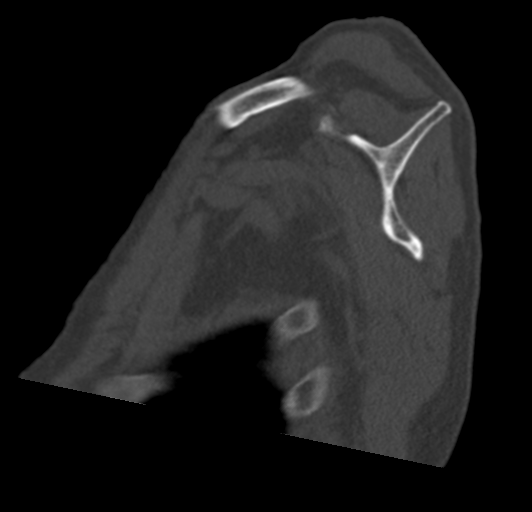
[im 66/114  bone]
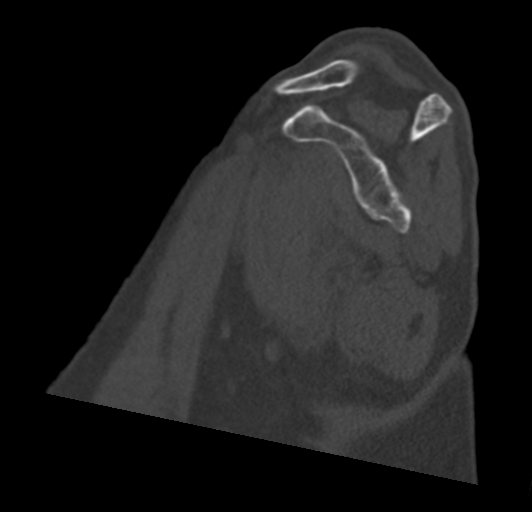
[im 76/114  bone]
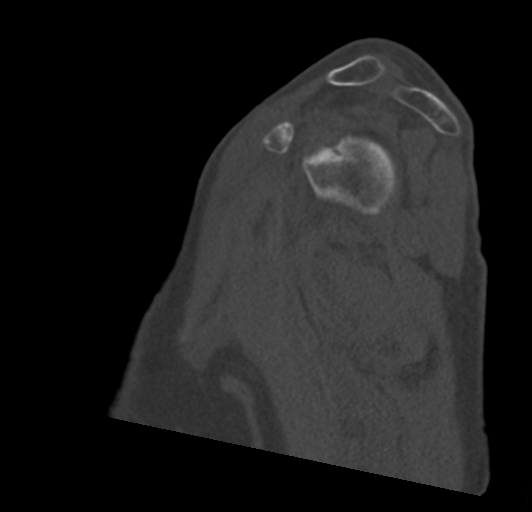

[15 of 29 positions shown; findings below may reference images not displayed]

FINDINGS: Bones/Joint/Cartilage

There is an impacted displaced fracture of the surgical neck of the
proximal left humerus. There is approximately 12 mm of lateral
displacement of the distal fracture fragment. There is approximately
17 mm of separation along the ventral margin of the fracture line,
with resulting approximately 30 degrees of ventral angulation at the
fracture site. The appearance is compatible with a Neer 2 part
fracture.

Os acromiale is incidentally noted. I do not see any scapular or
clavicular fractures. There is anatomic alignment of the
glenohumeral and acromioclavicular joints.

Visualized portions of the left thoracic cage are unremarkable.

Ligaments

Suboptimally assessed by CT.

Muscles and Tendons

No evidence of muscular injury.

Soft tissues

Mild soft tissue edema surrounding the fracture site. No fluid
collections. Visualized portions of the mediastinum are
unremarkable.
IMPRESSION: Impacted displaced fracture of the surgical neck of the proximal
left humerus, compatible with a Neer 2 part fracture.

## 2021-06-08 IMAGING — DX DG ELBOW COMPLETE 3+V*L*
4 series · 4 of 4 positions shown · non-contrast
Comparison: None.

CLINICAL DATA: Left elbow pain after fall

EXAM:
LEFT ELBOW - COMPLETE 3+ VIEW

[elbow ap]
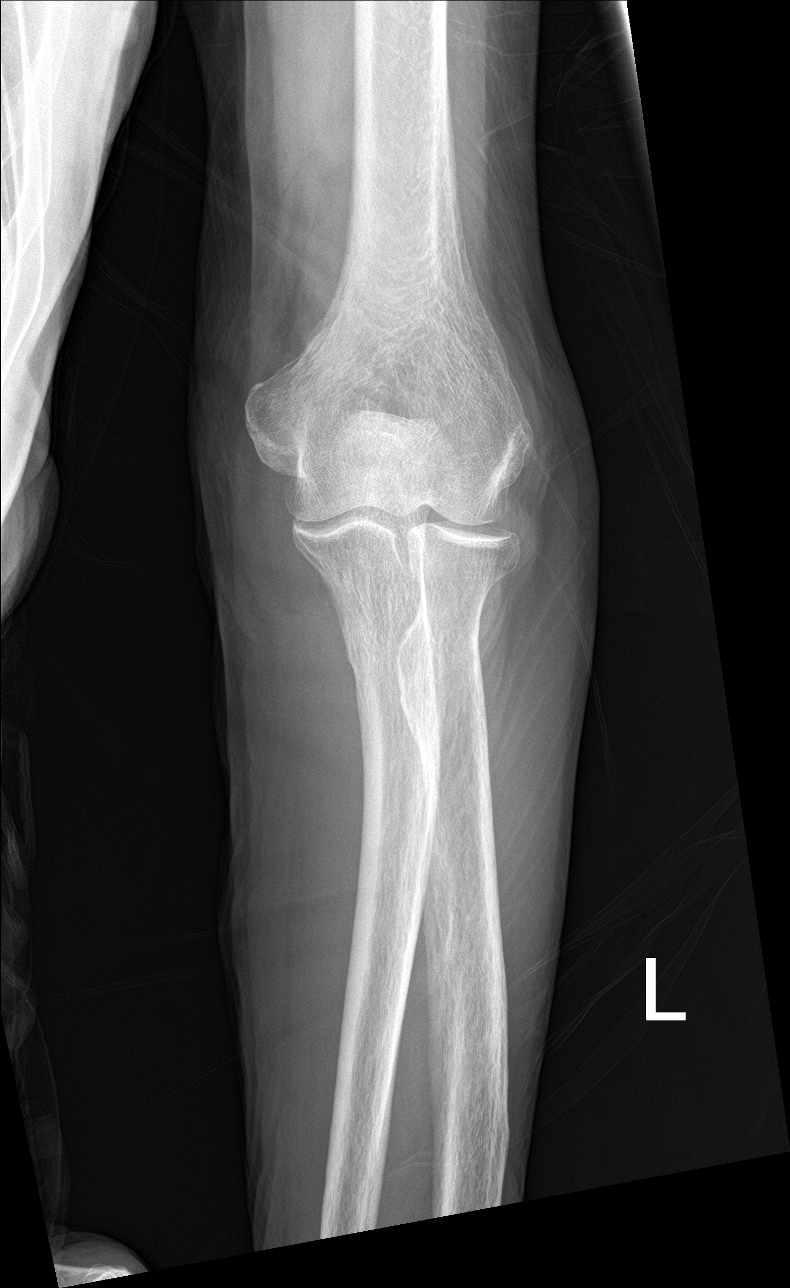

[elbow obl (1 of 2)]
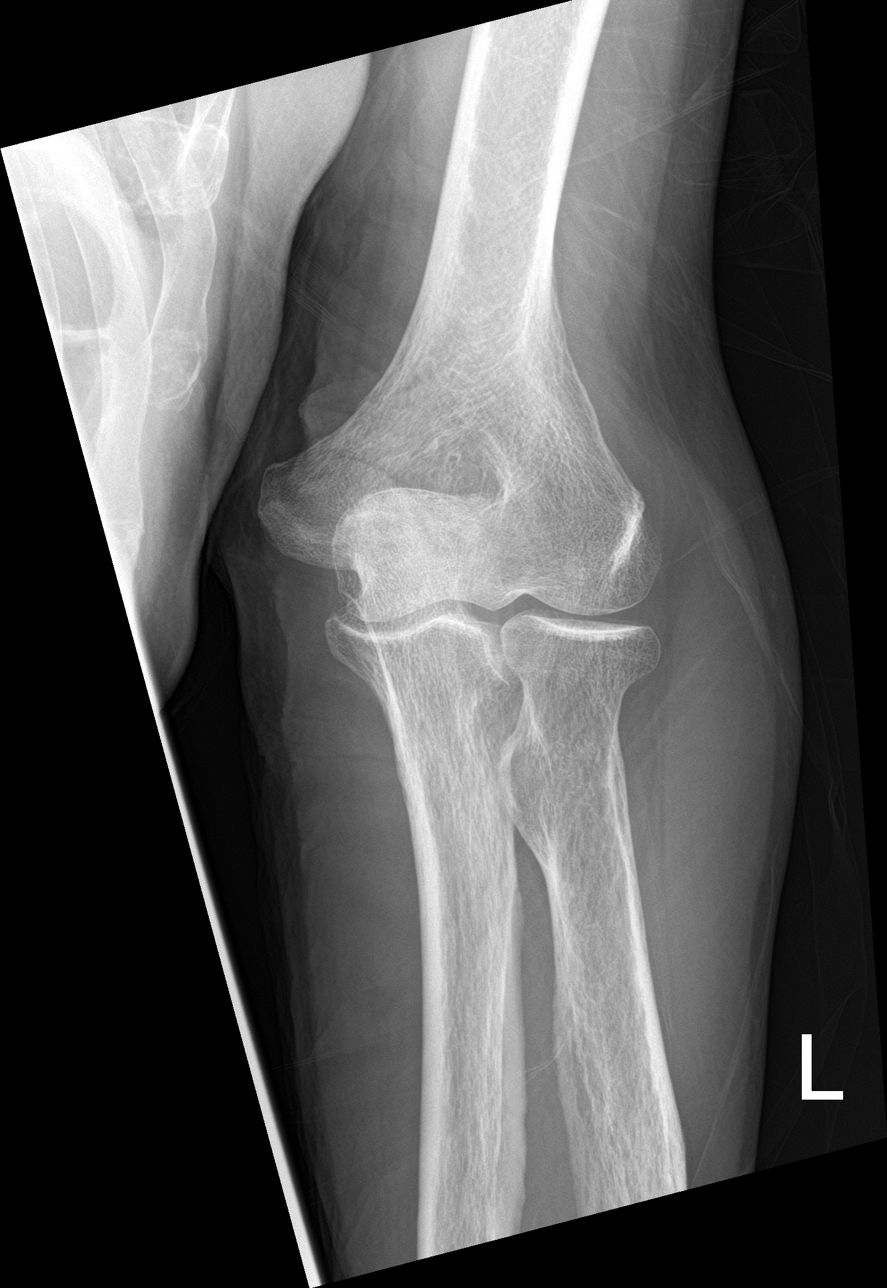

[elbow obl (2 of 2)]
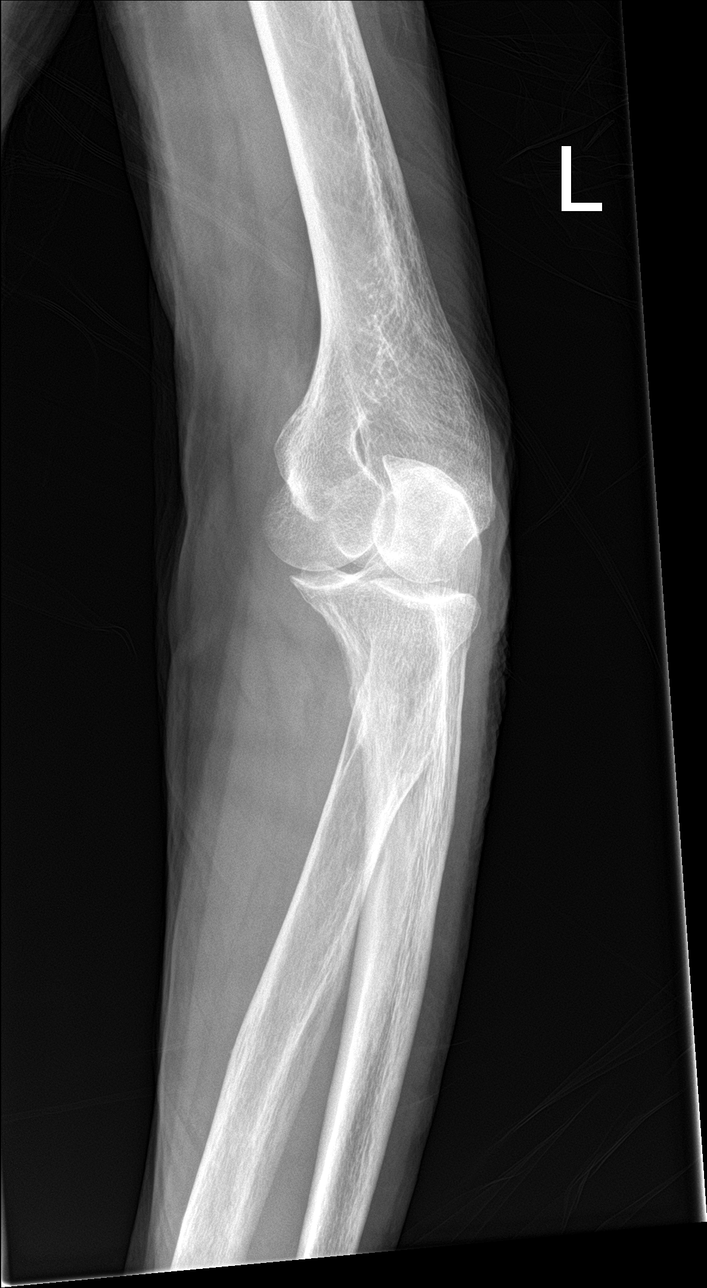

[elbow lat]
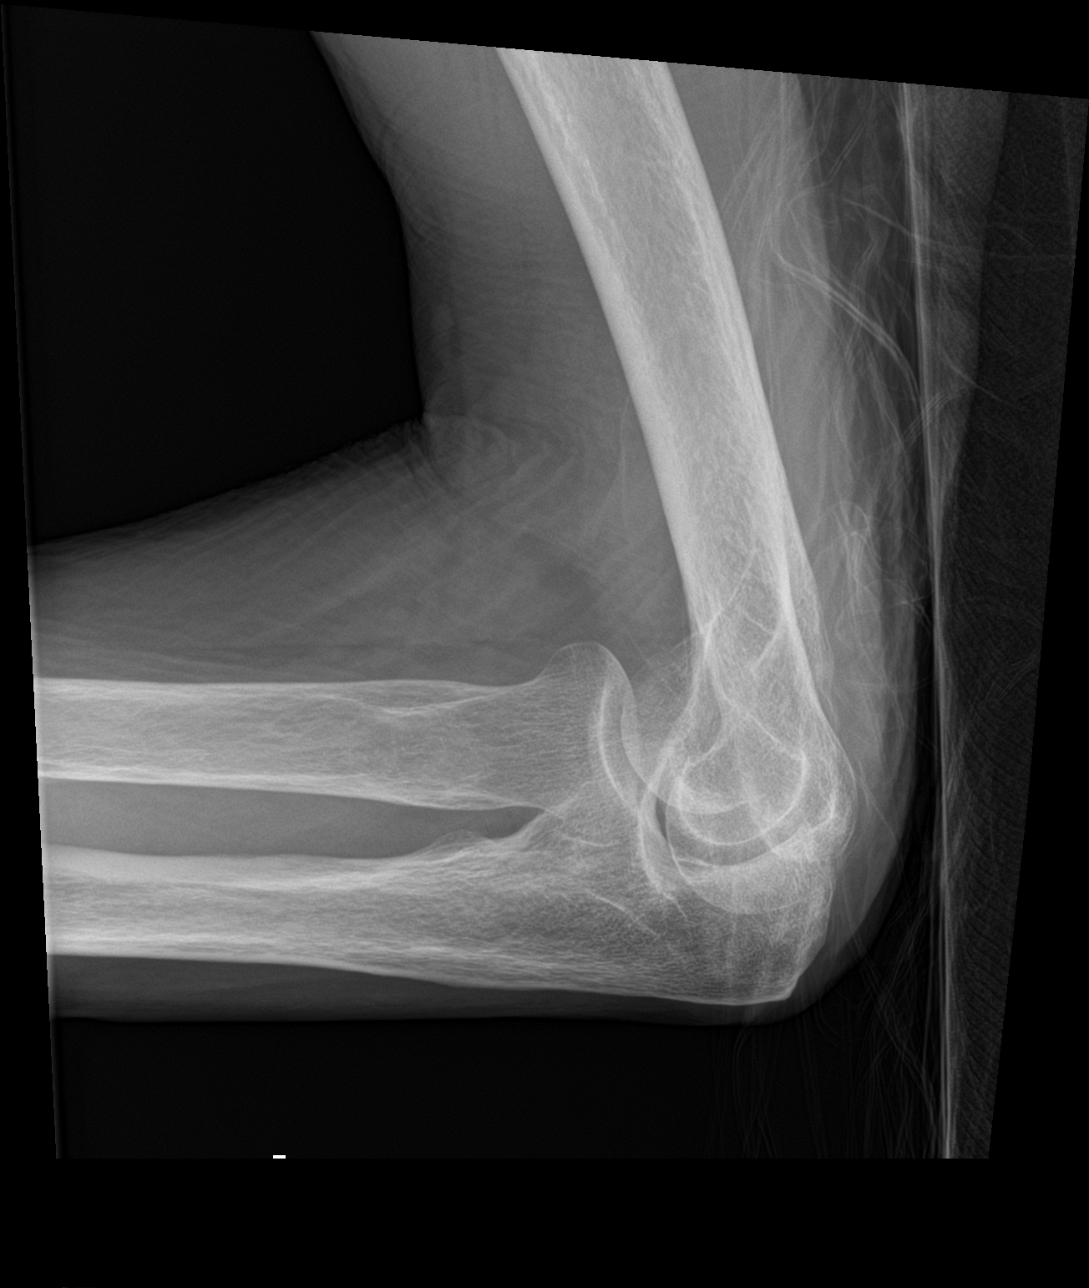

[4 of 4 positions shown; findings below may reference images not displayed]

FINDINGS: Frontal, bilateral oblique, and lateral views of the left elbow are
obtained. No acute displaced fracture, subluxation, or dislocation.
No joint effusion. Soft tissues are unremarkable.
IMPRESSION: 1. Unremarkable left elbow.

## 2021-06-19 DIAGNOSIS — R2689 Other abnormalities of gait and mobility: Secondary | ICD-10-CM | POA: Diagnosis not present

## 2021-06-19 DIAGNOSIS — R0789 Other chest pain: Secondary | ICD-10-CM | POA: Diagnosis not present

## 2021-06-19 DIAGNOSIS — Z9181 History of falling: Secondary | ICD-10-CM | POA: Diagnosis not present

## 2021-06-19 DIAGNOSIS — I2699 Other pulmonary embolism without acute cor pulmonale: Secondary | ICD-10-CM | POA: Diagnosis not present

## 2021-06-19 DIAGNOSIS — I10 Essential (primary) hypertension: Secondary | ICD-10-CM | POA: Diagnosis not present

## 2021-06-19 DIAGNOSIS — R079 Chest pain, unspecified: Secondary | ICD-10-CM | POA: Diagnosis not present

## 2021-06-19 DIAGNOSIS — E039 Hypothyroidism, unspecified: Secondary | ICD-10-CM | POA: Diagnosis not present

## 2021-06-19 DIAGNOSIS — Z79899 Other long term (current) drug therapy: Secondary | ICD-10-CM | POA: Diagnosis not present

## 2021-06-19 DIAGNOSIS — R55 Syncope and collapse: Secondary | ICD-10-CM | POA: Diagnosis not present

## 2021-06-20 DIAGNOSIS — W19XXXA Unspecified fall, initial encounter: Secondary | ICD-10-CM | POA: Diagnosis not present

## 2021-06-20 DIAGNOSIS — R0902 Hypoxemia: Secondary | ICD-10-CM | POA: Diagnosis not present

## 2021-06-20 DIAGNOSIS — R0789 Other chest pain: Secondary | ICD-10-CM | POA: Diagnosis not present

## 2021-06-20 DIAGNOSIS — R52 Pain, unspecified: Secondary | ICD-10-CM | POA: Diagnosis not present

## 2021-06-20 DIAGNOSIS — R079 Chest pain, unspecified: Secondary | ICD-10-CM | POA: Diagnosis not present

## 2021-06-20 DIAGNOSIS — R1111 Vomiting without nausea: Secondary | ICD-10-CM | POA: Diagnosis not present

## 2021-06-20 DIAGNOSIS — R55 Syncope and collapse: Secondary | ICD-10-CM | POA: Diagnosis not present

## 2021-06-20 DIAGNOSIS — I959 Hypotension, unspecified: Secondary | ICD-10-CM | POA: Diagnosis not present

## 2021-06-20 DIAGNOSIS — I10 Essential (primary) hypertension: Secondary | ICD-10-CM | POA: Diagnosis not present

## 2021-06-20 DIAGNOSIS — R609 Edema, unspecified: Secondary | ICD-10-CM | POA: Diagnosis not present

## 2021-06-20 DIAGNOSIS — I2699 Other pulmonary embolism without acute cor pulmonale: Secondary | ICD-10-CM | POA: Diagnosis not present

## 2021-06-21 DIAGNOSIS — R55 Syncope and collapse: Secondary | ICD-10-CM | POA: Diagnosis not present

## 2021-06-21 DIAGNOSIS — I10 Essential (primary) hypertension: Secondary | ICD-10-CM | POA: Diagnosis not present

## 2021-06-21 DIAGNOSIS — I2699 Other pulmonary embolism without acute cor pulmonale: Secondary | ICD-10-CM | POA: Diagnosis not present

## 2021-08-21 DIAGNOSIS — Z0100 Encounter for examination of eyes and vision without abnormal findings: Secondary | ICD-10-CM | POA: Diagnosis not present

## 2021-08-21 DIAGNOSIS — H524 Presbyopia: Secondary | ICD-10-CM | POA: Diagnosis not present

## 2021-08-21 DIAGNOSIS — H353131 Nonexudative age-related macular degeneration, bilateral, early dry stage: Secondary | ICD-10-CM | POA: Diagnosis not present

## 2021-08-29 DIAGNOSIS — R1319 Other dysphagia: Secondary | ICD-10-CM | POA: Diagnosis not present

## 2021-08-29 DIAGNOSIS — K225 Diverticulum of esophagus, acquired: Secondary | ICD-10-CM | POA: Diagnosis not present

## 2021-08-29 DIAGNOSIS — R1312 Dysphagia, oropharyngeal phase: Secondary | ICD-10-CM | POA: Diagnosis not present

## 2021-08-29 DIAGNOSIS — R1310 Dysphagia, unspecified: Secondary | ICD-10-CM | POA: Diagnosis not present

## 2021-08-31 DIAGNOSIS — Z79899 Other long term (current) drug therapy: Secondary | ICD-10-CM | POA: Diagnosis not present

## 2021-08-31 DIAGNOSIS — C61 Malignant neoplasm of prostate: Secondary | ICD-10-CM | POA: Diagnosis not present

## 2021-09-05 DIAGNOSIS — C61 Malignant neoplasm of prostate: Secondary | ICD-10-CM | POA: Diagnosis not present

## 2021-09-05 DIAGNOSIS — R339 Retention of urine, unspecified: Secondary | ICD-10-CM | POA: Diagnosis not present

## 2021-09-19 DIAGNOSIS — E039 Hypothyroidism, unspecified: Secondary | ICD-10-CM | POA: Diagnosis not present

## 2021-09-19 DIAGNOSIS — I10 Essential (primary) hypertension: Secondary | ICD-10-CM | POA: Diagnosis not present

## 2021-09-19 DIAGNOSIS — R634 Abnormal weight loss: Secondary | ICD-10-CM | POA: Diagnosis not present

## 2021-09-19 DIAGNOSIS — I480 Paroxysmal atrial fibrillation: Secondary | ICD-10-CM | POA: Diagnosis not present

## 2021-09-19 DIAGNOSIS — F4321 Adjustment disorder with depressed mood: Secondary | ICD-10-CM | POA: Diagnosis not present

## 2021-09-19 DIAGNOSIS — K225 Diverticulum of esophagus, acquired: Secondary | ICD-10-CM | POA: Diagnosis not present

## 2021-09-19 DIAGNOSIS — C61 Malignant neoplasm of prostate: Secondary | ICD-10-CM | POA: Diagnosis not present

## 2021-09-19 DIAGNOSIS — K219 Gastro-esophageal reflux disease without esophagitis: Secondary | ICD-10-CM | POA: Diagnosis not present

## 2021-09-19 DIAGNOSIS — I2699 Other pulmonary embolism without acute cor pulmonale: Secondary | ICD-10-CM | POA: Diagnosis not present

## 2021-11-29 DIAGNOSIS — Z23 Encounter for immunization: Secondary | ICD-10-CM | POA: Diagnosis not present

## 2021-12-05 DIAGNOSIS — L720 Epidermal cyst: Secondary | ICD-10-CM | POA: Diagnosis not present

## 2021-12-05 DIAGNOSIS — L821 Other seborrheic keratosis: Secondary | ICD-10-CM | POA: Diagnosis not present

## 2021-12-05 DIAGNOSIS — L570 Actinic keratosis: Secondary | ICD-10-CM | POA: Diagnosis not present

## 2021-12-05 DIAGNOSIS — L578 Other skin changes due to chronic exposure to nonionizing radiation: Secondary | ICD-10-CM | POA: Diagnosis not present

## 2022-02-27 DIAGNOSIS — Z139 Encounter for screening, unspecified: Secondary | ICD-10-CM | POA: Diagnosis not present

## 2022-02-27 DIAGNOSIS — E039 Hypothyroidism, unspecified: Secondary | ICD-10-CM | POA: Diagnosis not present

## 2022-02-27 DIAGNOSIS — Z9181 History of falling: Secondary | ICD-10-CM | POA: Diagnosis not present

## 2022-02-27 DIAGNOSIS — R634 Abnormal weight loss: Secondary | ICD-10-CM | POA: Diagnosis not present

## 2022-02-27 DIAGNOSIS — I10 Essential (primary) hypertension: Secondary | ICD-10-CM | POA: Diagnosis not present

## 2022-02-27 DIAGNOSIS — K225 Diverticulum of esophagus, acquired: Secondary | ICD-10-CM | POA: Diagnosis not present

## 2022-02-27 DIAGNOSIS — I2699 Other pulmonary embolism without acute cor pulmonale: Secondary | ICD-10-CM | POA: Diagnosis not present

## 2022-02-27 DIAGNOSIS — C61 Malignant neoplasm of prostate: Secondary | ICD-10-CM | POA: Diagnosis not present

## 2022-02-27 DIAGNOSIS — I480 Paroxysmal atrial fibrillation: Secondary | ICD-10-CM | POA: Diagnosis not present

## 2022-03-13 DIAGNOSIS — N401 Enlarged prostate with lower urinary tract symptoms: Secondary | ICD-10-CM | POA: Diagnosis not present

## 2022-03-13 DIAGNOSIS — R339 Retention of urine, unspecified: Secondary | ICD-10-CM | POA: Diagnosis not present

## 2022-03-13 DIAGNOSIS — C61 Malignant neoplasm of prostate: Secondary | ICD-10-CM | POA: Diagnosis not present

## 2022-03-13 DIAGNOSIS — R350 Frequency of micturition: Secondary | ICD-10-CM | POA: Diagnosis not present

## 2022-04-24 DIAGNOSIS — R42 Dizziness and giddiness: Secondary | ICD-10-CM | POA: Diagnosis not present

## 2022-04-24 DIAGNOSIS — Z79899 Other long term (current) drug therapy: Secondary | ICD-10-CM | POA: Diagnosis not present

## 2022-04-24 DIAGNOSIS — D649 Anemia, unspecified: Secondary | ICD-10-CM | POA: Diagnosis not present

## 2022-04-24 DIAGNOSIS — E538 Deficiency of other specified B group vitamins: Secondary | ICD-10-CM | POA: Diagnosis not present

## 2022-04-24 DIAGNOSIS — E559 Vitamin D deficiency, unspecified: Secondary | ICD-10-CM | POA: Diagnosis not present

## 2022-05-02 DIAGNOSIS — H9123 Sudden idiopathic hearing loss, bilateral: Secondary | ICD-10-CM | POA: Diagnosis not present

## 2022-05-02 DIAGNOSIS — S0990XA Unspecified injury of head, initial encounter: Secondary | ICD-10-CM | POA: Diagnosis not present

## 2022-05-02 DIAGNOSIS — H9190 Unspecified hearing loss, unspecified ear: Secondary | ICD-10-CM | POA: Diagnosis not present

## 2022-05-02 DIAGNOSIS — Z043 Encounter for examination and observation following other accident: Secondary | ICD-10-CM | POA: Diagnosis not present

## 2022-06-22 DIAGNOSIS — I10 Essential (primary) hypertension: Secondary | ICD-10-CM | POA: Diagnosis not present

## 2022-06-22 DIAGNOSIS — E559 Vitamin D deficiency, unspecified: Secondary | ICD-10-CM | POA: Diagnosis not present

## 2022-06-22 DIAGNOSIS — C61 Malignant neoplasm of prostate: Secondary | ICD-10-CM | POA: Diagnosis not present

## 2022-06-22 DIAGNOSIS — D649 Anemia, unspecified: Secondary | ICD-10-CM | POA: Diagnosis not present

## 2022-06-22 DIAGNOSIS — R42 Dizziness and giddiness: Secondary | ICD-10-CM | POA: Diagnosis not present

## 2022-06-22 DIAGNOSIS — E039 Hypothyroidism, unspecified: Secondary | ICD-10-CM | POA: Diagnosis not present

## 2022-06-22 DIAGNOSIS — F4321 Adjustment disorder with depressed mood: Secondary | ICD-10-CM | POA: Diagnosis not present

## 2022-07-02 DIAGNOSIS — C61 Malignant neoplasm of prostate: Secondary | ICD-10-CM | POA: Diagnosis not present

## 2022-07-11 ENCOUNTER — Telehealth: Payer: Self-pay

## 2022-07-11 NOTE — Telephone Encounter (Signed)
Pt's daughter called to ask if her dad could be seen sooner than Jun 10th? "I don't think he can't wait that long". I asked her what was going on with her father? She replied, "His testicles are swollen a lot. He is white as a ghost. Both of his feet look like they are going to pop. One of his legs is red, and warm to touch". Pt has been referred to [place] for prostate cancer. Dr Melvyn Neth is out of clinic this week. I notified [facility], of above. She recommends pt be seen by PCP, as the issues above are medical problems that would be handled by his medical doctor. Pt's daughter verbalized understanding.

## 2022-07-29 ENCOUNTER — Other Ambulatory Visit: Payer: Self-pay | Admitting: Oncology

## 2022-07-29 DIAGNOSIS — C7951 Secondary malignant neoplasm of bone: Secondary | ICD-10-CM

## 2022-07-29 NOTE — Progress Notes (Unsigned)
[facility]  [address] [place],  [place]  [address] [phone]  Clinic Day:  07/29/2022  Referring physician: Hurshel Party, NP   HISTORY OF PRESENT ILLNESS:  The patient is a 89 y.o. male  *** who I was asked to consult upon for metastatic prostate cancer.  A recent PSA came back elevated at 264.     PAST MEDICAL HISTORY:   Past Medical History:  Diagnosis Date   Hyperlipidemia    Hypothyroidism    Prostate cancer (HCC)     PAST SURGICAL HISTORY:   Past Surgical History:  Procedure Laterality Date   HUMERUS IM NAIL Left 06/12/2019   Procedure: INTRAMEDULLARY (IM) NAIL HUMERAL;  Surgeon: Bjorn Pippin, MD;  Location: [facility] OR;  Service: Orthopedics;  Laterality: Left;    CURRENT MEDICATIONS:   Current Outpatient Medications  Medication Sig Dispense Refill   Apoaequorin (PREVAGEN EXTRA STRENGTH PO) Take 2 capsules by mouth daily with breakfast.     levothyroxine (SYNTHROID, LEVOTHROID) 88 MCG tablet Take 88 mcg by mouth daily before breakfast.     No current facility-administered medications for this visit.    ALLERGIES:  No Known Allergies  FAMILY HISTORY:   Family History  Problem Relation Age of Onset   Diabetes type II Father    Bladder Cancer Brother     SOCIAL HISTORY:   reports that he has quit smoking. His smokeless tobacco use includes chew. He reports that he does not drink alcohol and does not use drugs.  REVIEW OF SYSTEMS:  Review of Systems - Oncology   PHYSICAL EXAM:  There were no vitals taken for this visit. Wt Readings from Last 3 Encounters:  06/11/19 138 lb (62.6 kg)  11/30/13 151 lb (68.5 kg)   There is no height or weight on file to calculate BMI. Performance status (ECOG): {CHL ONC [id number] Physical Exam  LABS:      Latest Ref Rng & Units 06/13/2019    8:58 AM 06/11/2019    3:20 PM 11/29/2013    2:40 AM  CBC  WBC 4.0 - 10.5 K/uL 8.5  9.6  9.4   Hemoglobin 13.0 - 17.0 g/dL 21.3  08.6   57.8   Hematocrit 39.0 - 52.0 % 35.1  38.9  34.3   Platelets 150 - 400 K/uL 186  225  214       Latest Ref Rng & Units 06/13/2019    8:58 AM 06/11/2019    3:20 PM 11/29/2013    2:40 AM  CMP  Glucose 70 - 99 mg/dL 469  92  629   BUN 8 - 23 mg/dL 16  17  23    Creatinine 0.61 - 1.24 mg/dL 5.28  4.13  2.44   Sodium 135 - 145 mmol/L 135  137  138   Potassium 3.5 - 5.1 mmol/L 3.8  4.6  4.3   Chloride 98 - 111 mmol/L 105  100  103   CO2 22 - 32 mmol/L 22  23  18    Calcium 8.9 - 10.3 mg/dL 7.7  8.9  8.8   Total Protein 6.0 - 8.3 g/dL   7.0   Total Bilirubin 0.3 - 1.2 mg/dL   0.3   Alkaline Phos 39 - 117 U/L   119   AST 0 - 37 U/L   24   ALT 0 - 53 U/L   18      No results found for: "CEA1", "CEA" / No results found  for: "CEA1", "CEA" No results found for: "PSA1" No results found for: "UJW119" No results found for: "CAN125"  No results found for: "TOTALPROTELP", "ALBUMINELP", "A1GS", "A2GS", "BETS", "BETA2SER", "GAMS", "MSPIKE", "SPEI" No results found for: "TIBC", "FERRITIN", "IRONPCTSAT" No results found for: "LDH"  No results found for: "AFPTUMOR", "TOTALPROTELP", "ALBUMINELP", "A1GS", "A2GS", "BETS", "BETA2SER", "GAMS", "MSPIKE", "SPEI", "LDH", "CEA1", "CEA", "PSA1", "IGASERUM", "IGGSERUM", "IGMSERUM", "THGAB", "THYROGLB"  Review Flowsheet        No data to display          STUDIES:  No results found.   ASSESSMENT & PLAN:  A 89 y.o. male who I was asked to consult upon for *** .The patient understands all the plans discussed today and is in agreement with them.  I do appreciate Moon, Amy A, NP for his new consult.   Tracie Dore Kirby Funk, MD

## 2022-07-30 ENCOUNTER — Inpatient Hospital Stay: Payer: Medicare HMO

## 2022-07-30 ENCOUNTER — Encounter: Payer: Self-pay | Admitting: Oncology

## 2022-07-30 ENCOUNTER — Other Ambulatory Visit: Payer: Self-pay | Admitting: Oncology

## 2022-07-30 ENCOUNTER — Inpatient Hospital Stay: Payer: Medicare HMO | Attending: Oncology | Admitting: Oncology

## 2022-07-30 VITALS — BP 118/75 | HR 121 | Temp 98.7°F | Resp 16

## 2022-07-30 DIAGNOSIS — C61 Malignant neoplasm of prostate: Secondary | ICD-10-CM

## 2022-07-30 DIAGNOSIS — C7951 Secondary malignant neoplasm of bone: Secondary | ICD-10-CM | POA: Diagnosis not present

## 2022-07-30 DIAGNOSIS — Z800 Family history of malignant neoplasm of digestive organs: Secondary | ICD-10-CM | POA: Diagnosis not present

## 2022-07-30 DIAGNOSIS — Z87891 Personal history of nicotine dependence: Secondary | ICD-10-CM

## 2022-07-30 DIAGNOSIS — D649 Anemia, unspecified: Secondary | ICD-10-CM | POA: Diagnosis not present

## 2022-07-30 DIAGNOSIS — Z8052 Family history of malignant neoplasm of bladder: Secondary | ICD-10-CM

## 2022-07-30 LAB — PSA: Prostatic Specific Antigen: 449.76 ng/mL — ABNORMAL HIGH (ref 0.00–4.00)

## 2022-07-30 LAB — CMP (CANCER CENTER ONLY)
ALT: 10 U/L (ref 0–44)
AST: 18 U/L (ref 15–41)
Albumin: 3.3 g/dL — ABNORMAL LOW (ref 3.5–5.0)
Alkaline Phosphatase: 649 U/L — ABNORMAL HIGH (ref 38–126)
Anion gap: 10 (ref 5–15)
BUN: 17 mg/dL (ref 8–23)
CO2: 25 mmol/L (ref 22–32)
Calcium: 8.7 mg/dL — ABNORMAL LOW (ref 8.9–10.3)
Chloride: 100 mmol/L (ref 98–111)
Creatinine: 0.91 mg/dL (ref 0.61–1.24)
GFR, Estimated: >60 mL/min (ref >60)
Glucose, Bld: 83 mg/dL (ref 70–99)
Potassium: 4.1 mmol/L (ref 3.5–5.1)
Sodium: 135 mmol/L (ref 135–145)
Total Bilirubin: 0.7 mg/dL (ref 0.3–1.2)
Total Protein: 6.5 g/dL (ref 6.5–8.1)

## 2022-07-30 LAB — CBC AND DIFFERENTIAL
HCT: 36 — AB (ref 41–53)
Hemoglobin: 12.2 — AB (ref 13.5–17.5)
Neutrophils Absolute: 5.85
Platelets: 272 10*3/uL (ref 150–400)
WBC: 7.9

## 2022-07-30 LAB — CBC: RBC: 4.14 (ref 3.87–5.11)

## 2022-07-30 MED ORDER — HYDROCODONE-ACETAMINOPHEN 5-325 MG PO TABS: 1.0000 | ORAL_TABLET | Freq: Four times a day (QID) | ORAL | 0 refills | Status: AC | PRN

## 2022-07-31 ENCOUNTER — Other Ambulatory Visit (HOSPITAL_COMMUNITY): Payer: Self-pay

## 2022-07-31 ENCOUNTER — Telehealth: Payer: Self-pay | Admitting: Pharmacy Technician

## 2022-07-31 ENCOUNTER — Telehealth: Payer: Self-pay

## 2022-07-31 NOTE — Telephone Encounter (Signed)
Oral Oncology Patient Advocate Encounter   Began application for assistance for Erleada through [facility].   Application will be submitted upon completion of necessary supporting documentation.   [employer] phone number [phone].   I will continue to check the status until final determination.   Jinger Neighbors, CPhT-Adv Oncology Pharmacy Patient Advocate [facility] Direct Number: [phone]  Fax: [phone]

## 2022-07-31 NOTE — Telephone Encounter (Signed)
Oral Oncology Pharmacist Encounter  Received new prescription for Erleada (apalutamide) for the treatment of metastatic castration sensitive prostate cancer in conjunction with Lupron, planned duration until disease progression or unacceptable toxicity.  Labs from 07/30/22 (CBC, CMP) assessed, no interventions needed. Prescription has not been sent in yet to assess.   Current medication list in Epic reviewed, DDIs with Erleada identified: - Synthroid: Erleada may decrease therapeutic effect of thyroid medications. Will need to be monitored for potential increase in thyroid doses. Will notify MD.   - Awanda MinkLavone Neri may decrease concentration of Norco. Patient is just starting medication and will need to monitor for decreased effects of the norco.   Evaluated chart and no patient barriers to medication adherence noted.   Patient agreement for treatment documented in MD note on 07/30/2022.  Prescription has been e-scribed to the [facility] for benefits analysis and approval.  Oral Oncology Clinic will continue to follow for insurance authorization, copayment issues, initial counseling and start date.  Bethel Born, PharmD Hematology/Oncology Clinical Pharmacist [facility] Oral Chemotherapy Navigation Clinic [phone] 07/31/2022 8:11 AM

## 2022-07-31 NOTE — Telephone Encounter (Signed)
Oral Oncology Patient Advocate Encounter  Prior Authorization for Lavone Neri has been approved.    PA# [id number] Effective dates: 6/11/4 through 01/27/23  Patients co-pay is $3,316.42.    Jinger Neighbors, CPhT-Adv Oncology Pharmacy Patient Advocate [facility] Direct Number: [phone]  Fax: [phone]

## 2022-07-31 NOTE — Telephone Encounter (Signed)
Oral Oncology Patient Advocate Encounter   Received notification that prior authorization for Derrick Odonnell is required.   PA submitted on 07/31/22 [id number] Status is pending     Jinger Neighbors, CPhT-Adv Oncology Pharmacy Patient Advocate [facility] Direct Number: [phone]  Fax: [phone]

## 2022-08-01 ENCOUNTER — Inpatient Hospital Stay (HOSPITAL_BASED_OUTPATIENT_CLINIC_OR_DEPARTMENT_OTHER): Payer: Medicare HMO

## 2022-08-01 DIAGNOSIS — C61 Malignant neoplasm of prostate: Secondary | ICD-10-CM

## 2022-08-01 NOTE — Telephone Encounter (Signed)
Oral Oncology Patient Advocate Encounter   Submitted application for assistance for Erleada to [employer].   Application submitted via e-fax to [phone]   [employer] phone number [phone].   I will continue to check the status until final determination.   Jaslyn Bansal, CPhT-Adv Oncology Pharmacy Patient Advocate [facility] Direct Number: [phone]  Fax: [phone]   

## 2022-08-02 NOTE — Progress Notes (Signed)
Oral Chemotherapy Pharmacist Encounter  I spoke with patients daughter Derrick Odonnell over the phone using two patient identifications for overview of: Erleada (apalutamide) for the treatment of metastatic, castration-sensitive prostate cancer, planned duration until disease progression or unacceptable toxicity.   Counseled patient on administration, dosing, side effects, monitoring, drug-food interactions, safe handling, storage, and disposal.  Patient will take Erleada 60mg  tablets, 4 tablets (240mg ) by mouth once daily without regard to food.  Erleada start date: once patient gets approved for patient assistance program  Adverse effects include but are not limited to: rash, peripheral edema, GI upset, hypertension, hot flashes, fatigue, and arthralgias.    Reviewed with patient importance of keeping a medication schedule and plan for any missed doses. No barriers to medication adherence identified.  Medication reconciliation performed and medication/allergy list updated.  Insurance authorization for Derrick Odonnell has been obtained. Due to high co-pay, patient will receive through patient assistance program.   Patient informed the pharmacy will reach out 5-7 days prior to needing next fill of Erleada to coordinate continued medication acquisition to prevent break in therapy.  All questions answered.  Patient voiced understanding and appreciation.   Medication education handout placed in mail for patient. Patient knows to call the office with questions or concerns. Oral Chemotherapy Clinic phone number provided to patient.   Patient will have follow up with Dr. Melvyn Neth on 08/09/22 to discuss scans. I will be in contact with patient and patients family regarding medication delivery and start date.   Derrick Odonnell, PharmD Hematology/Oncology Clinical Pharmacist [facility] Oral Chemotherapy Navigation Clinic [phone] 08/02/2022   7:36 PM

## 2022-08-06 ENCOUNTER — Ambulatory Visit: Payer: Medicare HMO | Admitting: Oncology

## 2022-08-06 NOTE — Telephone Encounter (Addendum)
Oral Oncology Patient Advocate Encounter   Received notification that the application for assistance for Erleada through [employer] has been approved.   [employer] phone number [phone].   Effective dates: 08/03/22 through 02/19/23  I have spoken to the patient.  Jinger Neighbors, CPhT-Adv Oncology Pharmacy Patient Advocate [facility] Direct Number: [phone]  Fax: [phone]

## 2022-08-08 DIAGNOSIS — K8020 Calculus of gallbladder without cholecystitis without obstruction: Secondary | ICD-10-CM | POA: Diagnosis not present

## 2022-08-08 DIAGNOSIS — C7951 Secondary malignant neoplasm of bone: Secondary | ICD-10-CM | POA: Diagnosis not present

## 2022-08-08 DIAGNOSIS — C61 Malignant neoplasm of prostate: Secondary | ICD-10-CM | POA: Diagnosis not present

## 2022-08-08 DIAGNOSIS — I700 Atherosclerosis of aorta: Secondary | ICD-10-CM | POA: Diagnosis not present

## 2022-08-08 DIAGNOSIS — J90 Pleural effusion, not elsewhere classified: Secondary | ICD-10-CM | POA: Diagnosis not present

## 2022-08-08 DIAGNOSIS — I2510 Atherosclerotic heart disease of native coronary artery without angina pectoris: Secondary | ICD-10-CM | POA: Diagnosis not present

## 2022-08-08 DIAGNOSIS — R918 Other nonspecific abnormal finding of lung field: Secondary | ICD-10-CM | POA: Diagnosis not present

## 2022-08-08 NOTE — Progress Notes (Signed)
[facility]  [address] [place],  [place]  [address] [phone]  Clinic Day:  08/09/2022  Referring physician: Hurshel Party, NP   HISTORY OF PRESENT ILLNESS:  The patient is a 89 y.o. male with metastatic prostate cancer.  A recent PSA came back elevated at 264.  He comes in today to go over his recent scans, as well as to reassess how he wishes to handle his disease.  Of note, his apalutamide pills were mailed to him recently.  However, he elected not to take them when his daughter gave one to him.  He comes in today feeling weak. He still has difficulty adjusting to life since his wife died a year ago.  Currently, it appears the patient is not interested in pursuing any form of palliative therapy for his disease management.    PHYSICAL EXAM:  Blood pressure 114/69, pulse 80, temperature 97.6 F (36.4 C), resp. rate 16, SpO2 99 %. Wt Readings from Last 3 Encounters:  06/11/19 138 lb (62.6 kg)  11/30/13 151 lb (68.5 kg)   There is no height or weight on file to calculate BMI. Performance status (ECOG): 3 - Symptomatic, >50% confined to bed Physical Exam Constitutional:      Appearance: Normal appearance. He is not ill-appearing.     Comments: A weak-appearing older gentleman who was in a wheelchair.  He is covered in a blanket.  HENT:     Mouth/Throat:     Mouth: Mucous membranes are moist.     Pharynx: Oropharynx is clear. No oropharyngeal exudate or posterior oropharyngeal erythema.  Cardiovascular:     Rate and Rhythm: Normal rate and regular rhythm.     Heart sounds: No murmur heard.    No friction rub. No gallop.  Pulmonary:     Effort: Pulmonary effort is normal. No respiratory distress.     Breath sounds: Normal breath sounds. No wheezing, rhonchi or rales.  Abdominal:     General: Bowel sounds are normal. There is no distension.     Palpations: Abdomen is soft. There is no mass.     Tenderness: There is no abdominal tenderness.   Musculoskeletal:        General: No swelling.     Right lower leg: No edema.     Left lower leg: No edema.  Lymphadenopathy:     Cervical: No cervical adenopathy.     Upper Body:     Right upper body: No supraclavicular or axillary adenopathy.     Left upper body: No supraclavicular or axillary adenopathy.     Lower Body: No right inguinal adenopathy. No left inguinal adenopathy.  Skin:    General: Skin is warm.     Coloration: Skin is not jaundiced.     Findings: No lesion or rash.  Neurological:     General: No focal deficit present.     Mental Status: He is alert and oriented to person, place, and time. Mental status is at baseline.  Psychiatric:        Mood and Affect: Mood normal.        Behavior: Behavior normal.        Thought Content: Thought content normal.   SCANS:  CT scans of his chest/abdomen/pelvis revealed the following: FINDINGS: CT CHEST FINDINGS  Cardiovascular: Aortic atherosclerosis. Normal heart size. Three-vessel coronary artery calcifications. No pericardial effusion.  Mediastinum/Nodes: No enlarged mediastinal, hilar, or axillary lymph nodes. Thyroid gland, trachea, and esophagus demonstrate no significant  findings.  Lungs/Pleura: Status post wedge deformity of the peripheral right upper lobe. Numerous clustered centrilobular and tree-in-bud nodules in the peripheral right upper lobe as well as the anterior inferior right upper lobe and medial segment right middle lobe are similar, however there are also multiple new nodules in this vicinity, for example a new 0.6 cm nodule of the posterior right upper lobe (series 301, image 57). Trace bilateral pleural effusions and associated atelectasis or consolidation.  Musculoskeletal: No chest wall abnormality. New relatively high-grade wedge deformity of the T7 vertebral body, presumed pathologic although of uncertain acuity. New, widespread sclerotic osseous metastatic disease throughout the included  chest, abdomen, and pelvis. Chronic, callused fracture deformity of the lower sternal body (series 602, image 102).  CT ABDOMEN PELVIS FINDINGS  Hepatobiliary: No solid liver abnormality is seen. Tiny gallstones. No gallbladder wall thickening, or biliary dilatation.  Pancreas: Unremarkable. No pancreatic ductal dilatation or surrounding inflammatory changes.  Spleen: Normal in size without significant abnormality.  Adrenals/Urinary Tract: Adrenal glands are unremarkable. Kidneys are normal, without renal calculi, solid lesion, or hydronephrosis. Bulky soft tissue throughout the bladder base and bladder neck, contiguous with the enlarged underlying prostate.  Stomach/Bowel: Stomach is within normal limits. Appendix appears normal. No evidence of bowel wall thickening, distention, or inflammatory changes. Descending and sigmoid diverticulosis.  Vascular/Lymphatic: Aortic atherosclerosis. Enlarged bilateral pelvic sidewall, iliac, and retroperitoneal lymph nodes, largest left retroperitoneal nodes measuring up to 2.6 x 2.0 cm (series 2, image 67).  Reproductive: Prostatomegaly with contiguous bulky soft tissue throughout the bladder base (series 602, image 110).  Other: No abdominal wall hernia or abnormality. No ascites.  Musculoskeletal: No acute osseous findings. Unchanged wedge deformity of the T12 vertebral body.  IMPRESSION: 1. Prostatomegaly with contiguous bulky soft tissue throughout the bladder base and bladder neck, consistent with locally recurrent prostate malignancy. 2. Enlarged bilateral pelvic sidewall, iliac, and retroperitoneal lymph nodes, consistent with nodal metastatic disease. 3. New, widespread, treated sclerotic osseous metastatic disease throughout the included chest, abdomen, and pelvis. 4. New relatively high-grade wedge deformity of the T7 vertebral body, presumed pathologic although of uncertain acuity. Correlate for acute point pain and  tenderness. 5. Numerous clustered centrilobular and tree-in-bud nodules in the peripheral right upper lobe as well as the anterior inferior right upper lobe and medial segment right middle lobe are similar, however there are also multiple new nodules in this vicinity. New nodules are most in keeping with worsened, ongoing atypical infection such as atypical mycobacterium, however small metastases not strictly excluded in the setting of other clearly established metastatic disease. Attention on follow-up. 6. Trace bilateral pleural effusions and associated atelectasis or consolidation. 7. Coronary artery disease. 8. Cholelithiasis.  Aortic Atherosclerosis (ICD10-I70.0).  LABS:       Latest Ref Rng & Units 07/30/2022   12:00 AM 06/13/2019    8:58 AM 06/11/2019    3:20 PM  CBC  WBC  7.9     8.5  9.6   Hemoglobin 13.5 - 17.5 12.2     11.4  12.6   Hematocrit 41 - 53 36     35.1  38.9   Platelets 150 - 400 K/uL 272     186  225      This result is from an external source.      Latest Ref Rng & Units 07/30/2022    2:18 PM 06/13/2019    8:58 AM 06/11/2019    3:20 PM  CMP  Glucose 70 - 99 mg/dL 83  161  92   BUN 8 - 23 mg/dL 17  16  17    Creatinine 0.61 - 1.24 mg/dL 1.61  0.96  0.45   Sodium 135 - 145 mmol/L 135  135  137   Potassium 3.5 - 5.1 mmol/L 4.1  3.8  4.6   Chloride 98 - 111 mmol/L 100  105  100   CO2 22 - 32 mmol/L 25  22  23    Calcium 8.9 - 10.3 mg/dL 8.7  7.7  8.9   Total Protein 6.5 - 8.1 g/dL 6.5     Total Bilirubin 0.3 - 1.2 mg/dL 0.7     Alkaline Phos 38 - 126 U/L 649     AST 15 - 41 U/L 18     ALT 0 - 44 U/L 10       Latest Reference Range & Units 07/30/22 14:18  Prostatic Specific Antigen 0.00 - 4.00 ng/mL 449.76 (H)  (H): Data is abnormally high  ASSESSMENT & PLAN:  A 89 y.o. male with metastatic prostate cancer.  In clinic today, I went over all of his CT scan images with him and his family, for which they could see his extensive osseous metastases.   After looking at his scans and evaluating his current clinical picture, it appears he wishes to take a comfort care-only for his disease management.  His daughters are also in agreement with this approach.  I will call Hospice so they may evaluate him as promptly as possible to begin his comfort/terminal care.  No scheduled follow-up visits will be made.  However, the family knows to contact us if they have additional questions/concerns regarding his clinical picture.    Anchor Dwan Kirby Funk, MD

## 2022-08-09 ENCOUNTER — Inpatient Hospital Stay: Payer: Medicare HMO | Admitting: Oncology

## 2022-08-09 VITALS — BP 114/69 | HR 80 | Temp 97.6°F | Resp 16

## 2022-08-09 DIAGNOSIS — C61 Malignant neoplasm of prostate: Secondary | ICD-10-CM

## 2022-08-09 NOTE — Progress Notes (Signed)
Referral was sent to Hospice electronically.

## 2022-08-16 ENCOUNTER — Encounter: Payer: Self-pay | Admitting: Oncology

## 2022-09-20 DEATH — deceased
